# Patient Record
Sex: Female | Born: 1975
Health system: Southern US, Community
[De-identification: ages and names within clinical notes are randomized; demographics above are authoritative.]

## PROBLEM LIST (undated history)

## (undated) DIAGNOSIS — I10 Essential (primary) hypertension: Secondary | ICD-10-CM

## (undated) DIAGNOSIS — A549 Gonococcal infection, unspecified: Secondary | ICD-10-CM

## (undated) DIAGNOSIS — Z872 Personal history of diseases of the skin and subcutaneous tissue: Secondary | ICD-10-CM

## (undated) DIAGNOSIS — Z8619 Personal history of other infectious and parasitic diseases: Secondary | ICD-10-CM

## (undated) DIAGNOSIS — Z8742 Personal history of other diseases of the female genital tract: Secondary | ICD-10-CM

## (undated) DIAGNOSIS — E119 Type 2 diabetes mellitus without complications: Secondary | ICD-10-CM

## (undated) DIAGNOSIS — D219 Benign neoplasm of connective and other soft tissue, unspecified: Secondary | ICD-10-CM

## (undated) HISTORY — DX: Essential (primary) hypertension: I10

## (undated) HISTORY — DX: Type 2 diabetes mellitus without complications: E11.9

## (undated) HISTORY — DX: Benign neoplasm of connective and other soft tissue, unspecified: D21.9

## (undated) HISTORY — DX: Gonococcal infection, unspecified: A54.9

## (undated) HISTORY — PX: DILATION AND CURETTAGE OF UTERUS: SHX78

## (undated) HISTORY — PX: BREAST BIOPSY: SHX20

## (undated) HISTORY — DX: Personal history of diseases of the skin and subcutaneous tissue: Z87.2

## (undated) HISTORY — DX: Personal history of other diseases of the female genital tract: Z87.42

## (undated) HISTORY — DX: Personal history of other infectious and parasitic diseases: Z86.19

---

## 2000-12-25 ENCOUNTER — Other Ambulatory Visit: Admission: RE | Admit: 2000-12-25 | Discharge: 2000-12-25 | Payer: Self-pay | Admitting: Family Medicine

## 2002-04-22 ENCOUNTER — Other Ambulatory Visit: Admission: RE | Admit: 2002-04-22 | Discharge: 2002-04-22 | Payer: Self-pay | Admitting: Family Medicine

## 2003-05-26 ENCOUNTER — Other Ambulatory Visit: Admission: RE | Admit: 2003-05-26 | Discharge: 2003-05-26 | Payer: Self-pay | Admitting: Family Medicine

## 2004-08-15 ENCOUNTER — Other Ambulatory Visit: Admission: RE | Admit: 2004-08-15 | Discharge: 2004-08-15 | Payer: Self-pay | Admitting: Family Medicine

## 2005-11-08 ENCOUNTER — Other Ambulatory Visit: Admission: RE | Admit: 2005-11-08 | Discharge: 2005-11-08 | Payer: Self-pay | Admitting: Family Medicine

## 2007-04-01 ENCOUNTER — Other Ambulatory Visit: Admission: RE | Admit: 2007-04-01 | Discharge: 2007-04-01 | Payer: Self-pay | Admitting: Family Medicine

## 2008-04-09 ENCOUNTER — Other Ambulatory Visit: Admission: RE | Admit: 2008-04-09 | Discharge: 2008-04-09 | Payer: Self-pay | Admitting: Family Medicine

## 2009-07-01 ENCOUNTER — Other Ambulatory Visit: Admission: RE | Admit: 2009-07-01 | Discharge: 2009-07-01 | Payer: Self-pay | Admitting: Family Medicine

## 2010-11-01 ENCOUNTER — Other Ambulatory Visit: Payer: Self-pay | Admitting: Family Medicine

## 2010-11-01 ENCOUNTER — Other Ambulatory Visit
Admission: RE | Admit: 2010-11-01 | Discharge: 2010-11-01 | Payer: Self-pay | Source: Home / Self Care | Admitting: Family Medicine

## 2010-11-05 ENCOUNTER — Encounter: Payer: Self-pay | Admitting: Obstetrics and Gynecology

## 2011-08-01 DIAGNOSIS — D219 Benign neoplasm of connective and other soft tissue, unspecified: Secondary | ICD-10-CM

## 2011-08-01 HISTORY — DX: Benign neoplasm of connective and other soft tissue, unspecified: D21.9

## 2011-11-21 DIAGNOSIS — L408 Other psoriasis: Secondary | ICD-10-CM | POA: Insufficient documentation

## 2011-11-21 DIAGNOSIS — T148XXA Other injury of unspecified body region, initial encounter: Secondary | ICD-10-CM | POA: Insufficient documentation

## 2012-01-25 ENCOUNTER — Encounter: Payer: Self-pay | Admitting: Obstetrics and Gynecology

## 2012-01-30 ENCOUNTER — Telehealth: Payer: Self-pay | Admitting: Obstetrics and Gynecology

## 2012-02-01 ENCOUNTER — Other Ambulatory Visit: Payer: Self-pay

## 2012-02-01 MED ORDER — NORETHIN ACE-ETH ESTRAD-FE 1-20 MG-MCG PO TABS: 1.0000 | ORAL_TABLET | Freq: Every day | ORAL | Status: DC

## 2012-02-01 NOTE — Telephone Encounter (Signed)
Spoke with pt rgd msg pt want rf on rx junel fe 1/20 informed rx called to pharm pt voice understanding.

## 2012-02-01 NOTE — Telephone Encounter (Signed)
CHART ROUTED TO NG

## 2012-02-11 ENCOUNTER — Encounter: Payer: Self-pay | Admitting: Obstetrics and Gynecology

## 2012-02-19 ENCOUNTER — Encounter: Payer: Self-pay | Admitting: Obstetrics and Gynecology

## 2012-02-19 ENCOUNTER — Ambulatory Visit (INDEPENDENT_AMBULATORY_CARE_PROVIDER_SITE_OTHER): Payer: 59 | Admitting: Obstetrics and Gynecology

## 2012-02-19 VITALS — BP 120/78 | Ht 67.0 in | Wt 231.0 lb

## 2012-02-19 DIAGNOSIS — D219 Benign neoplasm of connective and other soft tissue, unspecified: Secondary | ICD-10-CM | POA: Insufficient documentation

## 2012-02-19 DIAGNOSIS — D259 Leiomyoma of uterus, unspecified: Secondary | ICD-10-CM

## 2012-02-19 MED ORDER — NORETHIN ACE-ETH ESTRAD-FE 1-20 MG-MCG PO TABS: 1.0000 | ORAL_TABLET | Freq: Every day | ORAL | Status: DC

## 2012-02-19 NOTE — Progress Notes (Signed)
Pt here for f/u symptomatic fibroids.  She is on ocps to help with the bleeding.  Her periods have been normal except one when she bled tor 2 weeks.  No missed pills.  No cp or SOB. Pt does not desire to conceive at this time BP 120/78  Ht 5\' 7"  (1.702 m)  Wt 231 lb (104.781 kg)  BMI 36.18 kg/m2  LMP 02/07/2012 Physical Examination: General appearance - alert, well appearing, and in no distress Mental status - alert, oriented to person, place, and time Chest - clear to auscultation, no wheezes, rales or rhonchi, symmetric air entry Heart - normal rate, regular rhythm, normal S1, S2, no murmurs, rubs, clicks or gallops Abdomen - soft, nontender, nondistended, no masses or organomegaly Pelvic - VULVA: normal appearing vulva with no masses, tenderness or lesions, VAGINA: normal appearing vagina with normal color and discharge, no lesions, CERVIX: normal appearing cervix without discharge or lesions, UTERUS: uterus is normal size, shape, consistency and nontender, enlarged to 14 week's size, ADNEXA: normal adnexa in size, nontender and no masses Back exam - full range of motion, no tenderness, palpable spasm or pain on motion Neurological - alert, oriented, normal speech, no focal findings or movement disorder noted Musculoskeletal - no joint tenderness, deformity or swelling Sx fibroids Periods normal with ocps bp and exam normal can continue ocps Rt 4 months for annual

## 2012-06-12 ENCOUNTER — Ambulatory Visit: Payer: 59 | Admitting: Obstetrics and Gynecology

## 2012-07-29 ENCOUNTER — Ambulatory Visit: Payer: 59 | Admitting: Obstetrics and Gynecology

## 2012-08-01 ENCOUNTER — Ambulatory Visit (INDEPENDENT_AMBULATORY_CARE_PROVIDER_SITE_OTHER): Payer: 59 | Admitting: Obstetrics and Gynecology

## 2012-08-01 ENCOUNTER — Encounter: Payer: Self-pay | Admitting: Obstetrics and Gynecology

## 2012-08-01 VITALS — BP 122/84 | Ht 66.0 in | Wt 230.0 lb

## 2012-08-01 DIAGNOSIS — D259 Leiomyoma of uterus, unspecified: Secondary | ICD-10-CM

## 2012-08-01 DIAGNOSIS — D219 Benign neoplasm of connective and other soft tissue, unspecified: Secondary | ICD-10-CM

## 2012-08-01 DIAGNOSIS — Z124 Encounter for screening for malignant neoplasm of cervix: Secondary | ICD-10-CM

## 2012-08-01 NOTE — Patient Instructions (Signed)
Fibroids Fibroids are lumps (tumors) that can occur any place in a woman's body. These lumps are not cancerous. Fibroids vary in size, weight, and where they grow. HOME CARE  Do not take aspirin.  Write down the number of pads or tampons you use during your period. Tell your doctor. This can help determine the best treatment for you. GET HELP RIGHT AWAY IF:  You have pain in your lower belly (abdomen) that is not helped with medicine.  You have cramps that are not helped with medicine.  You have more bleeding between or during your period.  You feel lightheaded or pass out (faint).  Your lower belly pain gets worse. MAKE SURE YOU:  Understand these instructions.  Will watch your condition.  Will get help right away if you are not doing well or get worse. Document Released: 11/03/2010 Document Revised: 12/24/2011 Document Reviewed: 11/03/2010 [employer] Patient Information 2013 [employer].  

## 2012-08-01 NOTE — Progress Notes (Signed)
Last Pap: 2012 WNL: Yes Regular Periods:yes Contraception: junel  Monthly Breast exam:yes Tetanus<22yrs:yes Nl.Bladder Function:yes Daily BMs:yes Healthy Diet:yes Calcium:yes Mammogram:no Date of Mammogram: n/a Exercise:yes Have often Exercise: occ Seatbelt: yes Abuse at home: no Stressful work:no Sigmoid-colonoscopy: n/a Bone Density: No PCP: Dr.Carola Westermenn Change in PMH: no change Change in FMH:no change BP 122/84  Ht 5\' 6"  (1.676 m)  Wt 230 lb (104.327 kg)  BMI 37.12 kg/m2  LMP 07/24/2012 Pt with complaints:yes and she has frequent urination and spotting 10 days before eachc ycle Physical Examination: General appearance - alert, well appearing, and in no distress Mental status - normal mood, behavior, speech, dress, motor activity, and thought processes Neck - supple, no significant adenopathy,  thyroid exam: thyroid is normal in size without nodules or tenderness Chest - clear to auscultation, no wheezes, rales or rhonchi, symmetric air entry Heart - normal rate and regular rhythm Abdomen - soft, nontender, nondistended, no masses or organomegaly Breasts - breasts appear normal, no suspicious masses, no skin or nipple changes or axillary nodes Pelvic - normal external genitalia, vulva, vagina, cervix,  and adnexa uterus about 12 weeks size and irreg Rectal - rectal exam not indicated Back exam - full range of motion, no tenderness, palpable spasm or pain on motion Neurological - alert, oriented, normal speech, no focal findings or movement disorder noted Musculoskeletal - no joint tenderness, deformity or swelling Extremities - no edema, redness or tenderness in the calves or thighs Skin - normal coloration and turgor, no rashes, no suspicious skin lesions noted Routine exam Sx fibroids.  Check urine cx Pap sent yes Mammogram due no lo estrin used for contraception RT 1 yr or prn Pt desires obs for sx fibroids for now she willcall if she changes her mind.

## 2012-08-04 LAB — PAP IG W/ RFLX HPV ASCU

## 2012-11-10 ENCOUNTER — Encounter: Payer: 59 | Admitting: Obstetrics and Gynecology

## 2012-11-20 ENCOUNTER — Encounter: Payer: 59 | Admitting: Obstetrics and Gynecology

## 2012-12-24 DIAGNOSIS — L219 Seborrheic dermatitis, unspecified: Secondary | ICD-10-CM | POA: Insufficient documentation

## 2014-01-27 ENCOUNTER — Other Ambulatory Visit (HOSPITAL_COMMUNITY)
Admission: RE | Admit: 2014-01-27 | Discharge: 2014-01-27 | Disposition: A | Discharge status: Home / Self Care | Payer: 59 | Source: Ambulatory Visit | Attending: Family Medicine | Admitting: Family Medicine

## 2014-01-27 ENCOUNTER — Other Ambulatory Visit: Payer: Self-pay | Admitting: Family Medicine

## 2014-01-27 DIAGNOSIS — Z124 Encounter for screening for malignant neoplasm of cervix: Secondary | ICD-10-CM | POA: Insufficient documentation

## 2014-01-27 DIAGNOSIS — Z1151 Encounter for screening for human papillomavirus (HPV): Secondary | ICD-10-CM | POA: Insufficient documentation

## 2014-01-28 ENCOUNTER — Other Ambulatory Visit: Payer: Self-pay | Admitting: Family Medicine

## 2014-01-28 DIAGNOSIS — N644 Mastodynia: Secondary | ICD-10-CM

## 2014-02-11 ENCOUNTER — Other Ambulatory Visit: Payer: Self-pay | Admitting: Family Medicine

## 2014-02-11 ENCOUNTER — Ambulatory Visit
Admission: RE | Admit: 2014-02-11 | Discharge: 2014-02-11 | Disposition: A | Discharge status: Home / Self Care | Payer: 59 | Source: Ambulatory Visit | Attending: Family Medicine | Admitting: Family Medicine

## 2014-02-11 DIAGNOSIS — N644 Mastodynia: Secondary | ICD-10-CM

## 2014-02-11 DIAGNOSIS — N6310 Unspecified lump in the right breast, unspecified quadrant: Secondary | ICD-10-CM

## 2014-02-18 ENCOUNTER — Ambulatory Visit
Admission: RE | Admit: 2014-02-18 | Discharge: 2014-02-18 | Disposition: A | Discharge status: Home / Self Care | Payer: 59 | Source: Ambulatory Visit | Attending: Family Medicine | Admitting: Family Medicine

## 2014-02-18 DIAGNOSIS — N6310 Unspecified lump in the right breast, unspecified quadrant: Secondary | ICD-10-CM

## 2016-01-24 ENCOUNTER — Ambulatory Visit: Payer: Self-pay | Admitting: Dietician

## 2016-02-22 ENCOUNTER — Encounter: Payer: Self-pay | Admitting: Dietician

## 2016-02-22 ENCOUNTER — Encounter: Payer: 59 | Attending: Internal Medicine | Admitting: Dietician

## 2016-02-22 DIAGNOSIS — E119 Type 2 diabetes mellitus without complications: Secondary | ICD-10-CM | POA: Diagnosis present

## 2016-02-22 NOTE — Patient Instructions (Addendum)
-  Start having breakfast every morning (oatmeal + boiled egg) -Establish a routine -Add a protein to fruit for morning snack (Babybel cheese) -Continue to avoid juice, soda, and any drink with sugar  -Exercise regularly -Preportion desserts (have 2 or 3 chocolate squares at a time)

## 2016-02-22 NOTE — Progress Notes (Signed)
Diabetes Self-Management Education  Visit Type: First/Initial  Appt. Start Time: 320 Appt. End Time: 415  02/22/2016  Kristen Wise, identified by name and date of birth, is a 40 y.o. female with a diagnosis of Diabetes: Type 2.   ASSESSMENT  There were no vitals taken for this visit. There is no weight on file to calculate BMI.      Diabetes Self-Management Education - 02/22/16 1644    Psychosocial Assessment   What is the last grade level you completed in school? Bachelor's degree   Pre-Education Assessment   Patient understands the diabetes disease and treatment process. Needs Instruction   Patient understands incorporating nutritional management into lifestyle. Needs Instruction   Patient undertands incorporating physical activity into lifestyle. Needs Instruction   Patient understands using medications safely. Needs Instruction   Patient understands monitoring blood glucose, interpreting and using results Needs Instruction   Patient understands prevention, detection, and treatment of acute complications. Needs Instruction   Patient understands prevention, detection, and treatment of chronic complications. Needs Instruction   Patient understands how to develop strategies to address psychosocial issues. Needs Instruction   Patient understands how to develop strategies to promote health/change behavior. Needs Instruction   Exercise   Exercise Type Light (walking / raking leaves)   How many days per week to you exercise? 7   How many minutes per day do you exercise? 30   Total minutes per week of exercise 210   Patient Education   Disease state  Definition of diabetes, type 1 and 2, and the diagnosis of diabetes;Factors that contribute to the development of diabetes   Nutrition management  Role of diet in the treatment of diabetes and the relationship between the three main macronutrients and blood glucose level;Carbohydrate counting;Food label reading, portion sizes and  measuring food.   Physical activity and exercise  Role of exercise on diabetes management, blood pressure control and cardiac health.   Personal strategies to promote health Lifestyle issues that need to be addressed for better diabetes care;Helped patient develop diabetes management plan for (enter comment)  diet counseling and macronutrient balance   Individualized Goals (developed by patient)   Nutrition Follow meal plan discussed   Physical Activity 30 minutes per day;45 minutes per day   Medications take my medication as prescribed   Post-Education Assessment   Patient understands the diabetes disease and treatment process. Demonstrates understanding / competency   Patient understands incorporating nutritional management into lifestyle. Demonstrates understanding / competency   Patient undertands incorporating physical activity into lifestyle. Demonstrates understanding / competency   Patient understands using medications safely. Demonstrates understanding / competency   Patient understands monitoring blood glucose, interpreting and using results Needs Review   Patient understands prevention, detection, and treatment of acute complications. Needs Review   Patient understands prevention, detection, and treatment of chronic complications. Needs Review   Patient understands how to develop strategies to address psychosocial issues. Needs Review   Patient understands how to develop strategies to promote health/change behavior. Needs Review   Outcomes   Expected Outcomes Demonstrated interest in learning. Expect positive outcomes   Future DMSE 3-4 months   Program Status Not Completed      Individualized Plan for Diabetes Self-Management Training:   Learning Objective:  Patient will have a greater understanding of diabetes self-management. Patient education plan is to attend individual and/or group sessions per assessed needs and concerns.   Plan:   Patient Instructions  -Start having  breakfast every morning (oatmeal + boiled  egg) -Establish a routine -Add a protein to fruit for morning snack (Babybel cheese) -Continue to avoid juice, soda, and any drink with sugar  -Exercise regularly -Preportion desserts (have 2 or 3 chocolate squares at a time)   Expected Outcomes:  Demonstrated interest in learning. Expect positive outcomes  Education material provided: Living Well with Diabetes, Meal plan card, My Plate and Snack sheet  If problems or questions, patient to contact team via:  Phone and Email  Future DSME appointment: 3-4 months

## 2016-07-09 ENCOUNTER — Ambulatory Visit: Payer: 59 | Admitting: Dietician

## 2016-10-01 ENCOUNTER — Other Ambulatory Visit: Payer: Self-pay | Admitting: Internal Medicine

## 2016-10-01 DIAGNOSIS — Z1231 Encounter for screening mammogram for malignant neoplasm of breast: Secondary | ICD-10-CM

## 2016-11-07 ENCOUNTER — Ambulatory Visit
Admission: RE | Admit: 2016-11-07 | Discharge: 2016-11-07 | Disposition: A | Discharge status: Home / Self Care | Payer: 59 | Source: Ambulatory Visit | Attending: Internal Medicine | Admitting: Internal Medicine

## 2016-11-07 DIAGNOSIS — Z1231 Encounter for screening mammogram for malignant neoplasm of breast: Secondary | ICD-10-CM

## 2016-11-21 DIAGNOSIS — R52 Pain, unspecified: Secondary | ICD-10-CM | POA: Insufficient documentation

## 2016-11-30 ENCOUNTER — Other Ambulatory Visit: Payer: Self-pay | Admitting: Obstetrics and Gynecology

## 2016-11-30 ENCOUNTER — Other Ambulatory Visit (HOSPITAL_COMMUNITY)
Admission: RE | Admit: 2016-11-30 | Discharge: 2016-11-30 | Disposition: A | Discharge status: Home / Self Care | Payer: 59 | Source: Ambulatory Visit | Attending: Obstetrics and Gynecology | Admitting: Obstetrics and Gynecology

## 2016-11-30 DIAGNOSIS — Z1151 Encounter for screening for human papillomavirus (HPV): Secondary | ICD-10-CM | POA: Insufficient documentation

## 2016-11-30 DIAGNOSIS — Z01419 Encounter for gynecological examination (general) (routine) without abnormal findings: Secondary | ICD-10-CM | POA: Diagnosis present

## 2016-12-03 LAB — CYTOLOGY - PAP
DIAGNOSIS: NEGATIVE
HPV: NOT DETECTED

## 2017-02-06 DIAGNOSIS — R399 Unspecified symptoms and signs involving the genitourinary system: Secondary | ICD-10-CM | POA: Insufficient documentation

## 2017-02-06 DIAGNOSIS — J029 Acute pharyngitis, unspecified: Secondary | ICD-10-CM | POA: Insufficient documentation

## 2017-05-01 DIAGNOSIS — Z6838 Body mass index (BMI) 38.0-38.9, adult: Secondary | ICD-10-CM | POA: Insufficient documentation

## 2017-06-04 ENCOUNTER — Encounter: Payer: Self-pay | Admitting: Neurology

## 2017-06-05 ENCOUNTER — Ambulatory Visit (INDEPENDENT_AMBULATORY_CARE_PROVIDER_SITE_OTHER): Payer: 59 | Admitting: Neurology

## 2017-06-05 ENCOUNTER — Encounter: Payer: Self-pay | Admitting: Neurology

## 2017-06-05 ENCOUNTER — Encounter (INDEPENDENT_AMBULATORY_CARE_PROVIDER_SITE_OTHER): Payer: Self-pay

## 2017-06-05 VITALS — BP 126/75 | HR 82 | Ht 66.0 in | Wt 242.5 lb

## 2017-06-05 DIAGNOSIS — Z72820 Sleep deprivation: Secondary | ICD-10-CM | POA: Insufficient documentation

## 2017-06-05 DIAGNOSIS — R0681 Apnea, not elsewhere classified: Secondary | ICD-10-CM

## 2017-06-05 DIAGNOSIS — K219 Gastro-esophageal reflux disease without esophagitis: Secondary | ICD-10-CM | POA: Diagnosis not present

## 2017-06-05 DIAGNOSIS — G4710 Hypersomnia, unspecified: Secondary | ICD-10-CM

## 2017-06-05 DIAGNOSIS — G4730 Sleep apnea, unspecified: Secondary | ICD-10-CM

## 2017-06-05 DIAGNOSIS — R0683 Snoring: Secondary | ICD-10-CM | POA: Insufficient documentation

## 2017-06-05 NOTE — Progress Notes (Signed)
SLEEP MEDICINE CLINIC   Provider:  Larey Seat, M D  Primary Care Physician:  Glendale Chard, MD   Referring Provider: Dr. Baird Cancer, MD  Chief Complaint  Patient presents with  . New Patient (Initial Visit)    scheduled for hysterectomy and she is just making sure she doesnt have any sleep apnea, husband informs the patient that she snores, she said few years back she was sleeping on her back and she felt herself stop breathing and so since then she sleeps on side.     HPI:  Kristen Wise is a 41 y.o. female , seen here as in a referral/ revisit  from Dr. Baird Cancer, for a sleep consultation , as patient is concerned about a upcoming surgery. Mrs. Kristen Wise has a history of obesity, with about body mass index of 38, hypertension currently controlled on medication, type 2 diabetes mellitus, on metformin and she is on 3 antihypertensive medications.  Chief complaint according to patient :"My husband tells me I snore very loud"    Sleep habits are as follows:  For the last hour before she retreats to the bedroom the patient will watch TV, and usually retreats by about midnight. The master bedroom is cool, quiet and dark. She has begun sleeping on the side as her husband told her she snores loudest in supine. She sleeps only with one pillow. Her husband goes to bed a little bit earlier than her and works as a Administrator. She usually wakes up between 2 and 3 AM with the urge to urinate, some nights more than once. She can go easily back to sleep. She reports vivid dreams but usually not nightmarish. As many people do,  she also dreams about falling sometimes , sometimes being chased, but often she laughs in her sleep. She may also talk in her sleep. She does set an alarm at 5 AM, but is often spontaneously awake just before. However, she does not allocate more than 5 hours of sleep.   Sleep medical history and family sleep history: The patient was not aware of any sleep disorders affecting  family members.    The patient remembers that she did last slept 7 or 8 hours nightly in her 73s. She had more energy than, she gained weight after , and she was neither hypertensive nor diabetic. She never sleep walked or had night terrors.   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 She is a nonsmoker, she seldomly drinks alcohol, she likes Starbucks coffee, and eats chocolates.  Review of Systems: Out of a complete 14 system review, the patient complains of only the following symptoms, and all other reviewed systems are negative. Patient reported snoring, the fatigue severity score was endorsed at 33 and this Epworth sleepiness score at 8 points.   I reviewed the patient's medications : amlodipine, olmersartan, spironolactone, iron supplement, metformin.     Social History   Social History  . Marital status: Unknown    Spouse name: N/A  . Number of children: N/A  . Years of education: N/A   Occupational History  . Not on file.   Social History Main Topics  . Smoking status: Never Smoker  . Smokeless tobacco: Never Used  . Alcohol use No  . Drug use: No  . Sexual activity: Not on file   Other Topics Concern  . Not on file   Social History Narrative  . No narrative on file    Family History  Problem Relation Age of Onset  . Diabetes Maternal Grandmother     Past Medical History:  Diagnosis Date  . Diabetes mellitus without complication ([place])   . Fibroid 08/01/2011   utrerine  . Gonorrhea   . H/O psoriasis   . History of chicken pox   . History of irregular menstrual bleeding   . Hypertension     Past Surgical History:  Procedure Laterality Date  . DILATION AND CURETTAGE OF UTERUS      Current Outpatient Prescriptions  Medication Sig Dispense Refill  . amLODipine (NORVASC) 10 MG tablet Take 10 mg by mouth daily.    Marland Kitchen BLISOVI FE 1.5/30 1.5-30 MG-MCG tablet     . ibuprofen (ADVIL,MOTRIN) 600 MG tablet Take 600 mg by  mouth every 6 (six) hours as needed.    . Iron-FA-B Cmp-C-Biot-Probiotic (FUSION PLUS PO) Take 1 tablet by mouth daily.    . metFORMIN (GLUCOPHAGE) 500 MG tablet Take by mouth 2 (two) times daily with a meal.    . olmesartan (BENICAR) 40 MG tablet Take 40 mg by mouth daily.    Marland Kitchen omeprazole (PRILOSEC) 40 MG capsule     . spironolactone (ALDACTONE) 50 MG tablet Take 50 mg by mouth daily.     No current facility-administered medications for this visit.     Allergies as of 06/05/2017 - Review Complete 06/05/2017  Allergen Reaction Noted  . Lisinopril Other (See Comments) 03/11/2012    Vitals: BP 126/75   Pulse 82   Ht 5\' 6"  (1.676 m)   Wt 242 lb 8 oz (110 kg)   BMI 39.14 kg/m  Last Weight:  Wt Readings from Last 1 Encounters:  06/05/17 242 lb 8 oz (110 kg)   TGG:YIRS mass index is 39.14 kg/m.     Last Height:   Ht Readings from Last 1 Encounters:  06/05/17 5\' 6"  (1.676 m)    Physical exam:  General: The patient is awake, alert and appears not in acute distress. The patient is well groomed. Head: Normocephalic, atraumatic. Neck is supple. Mallampati 5, large tongue   neck circumference:19. Nasal airflow patent ,Retrognathia is noted  Cardiovascular:  Regular rate and rhythm , without  murmurs or carotid bruit, and without distended neck veins. Respiratory: Lungs are clear to auscultation. Skin:  Without evidence of edema, or rash Trunk: BMI is 39. The patient's posture is erect.   Neurologic exam : The patient is awake and alert, oriented to place and time.   Attention span & concentration ability appears normal.  Speech is fluent,  without  dysarthria, dysphonia or aphasia.  Mood and affect are appropriate.  Cranial nerves: Pupils are equal and briskly reactive to light. Extraocular movements  in vertical and horizontal planes intact and without nystagmus. Visual fields by finger perimetry are intact. Hearing to finger rub intact.  Facial sensation intact to fine touch.  Facial motor strength is symmetric and tongue and uvula move midline. Shoulder shrug was symmetrical.   Motor exam: Normal tone, muscle bulk and symmetric strength in all extremities. Sensory:  Fine touch, pinprick and vibration were tested in all extremities.  Coordination: Rapid alternating movements in the fingers/hands was normal. Finger-to-nose maneuver  normal without evidence of ataxia, dysmetria or tremor. Gait and station: Patient walks without assistive device .Tandem gait is unfragmented. She tends to lean to the left. Turns with  3  Steps.  Deep tendon reflexes: in the  upper and lower extremities are symmetric and intact. Babinski maneuver response is downgoing.  Assessment:  After physical and neurologic examination, review of laboratory studies,  Personal review of imaging studies, reports of other /same  Imaging studies, results of polysomnography and / or neurophysiology testing and pre-existing records as far as provided in visit., my assessment is   1) Mrs. Grenfell has essentially 2 sleep conditions, the first one is she gets not enough sleep. I advised her to try to go to bed before midnight maybe around 10 and about the importance of slow wave sleep in the first 2 hours of sleep. She may also find more success in weight loss, diabetes control and hypertension reduction if she gets those pressures 2 hours of slow wave sleep.  2) I strongly suspect that Mrs. Halley has obstructive sleep apnea. Her neck circumference is about 19 inches, she has a high-grade Mallampati with a swollen uvula, and her BMI has exceeded 39. That her husband has witnessed her to snore on her back is not surprising, but she has a physiological makeup to also have obstructive sleep apnea and not just simple upper airway resistance.  The patient was advised of the nature of the diagnosed disorder , the treatment options and the  risks for general health and wellness arising from not treating the condition.    I spent more than 45 minutes of face to face time with the patient.  Greater than 50% of time was spent in counseling and coordination of care. We have discussed the diagnosis and differential and I answered the patient's questions.    Plan:  Treatment plan and additional workup : HST, and sleep hygiene instruction.    Larey Seat, MD 12/13/6008, 9:32 AM  Certified in Neurology by ABPN Certified in [place] by [facility] [address] [place],  [address]

## 2017-06-05 NOTE — Patient Instructions (Signed)

## 2017-07-03 ENCOUNTER — Institutional Professional Consult (permissible substitution): Payer: Self-pay | Admitting: Neurology

## 2017-07-03 ENCOUNTER — Ambulatory Visit (INDEPENDENT_AMBULATORY_CARE_PROVIDER_SITE_OTHER): Payer: 59 | Admitting: Neurology

## 2017-07-03 DIAGNOSIS — R0681 Apnea, not elsewhere classified: Principal | ICD-10-CM

## 2017-07-03 DIAGNOSIS — R0683 Snoring: Secondary | ICD-10-CM

## 2017-07-03 DIAGNOSIS — G4710 Hypersomnia, unspecified: Secondary | ICD-10-CM

## 2017-07-03 DIAGNOSIS — Z72820 Sleep deprivation: Secondary | ICD-10-CM

## 2017-07-03 DIAGNOSIS — K219 Gastro-esophageal reflux disease without esophagitis: Secondary | ICD-10-CM

## 2017-07-03 DIAGNOSIS — G4730 Sleep apnea, unspecified: Secondary | ICD-10-CM

## 2017-07-05 NOTE — Procedures (Signed)
NAME: [name]                   DOB: 12/13/1975 MEDICAL RECORD [id number]      DOS: 07/03/17 REFERRING PHYSICIAN: Glendale Chard, MD STUDY PERFORMED: Home Sleep Study HISTORY:  Kristen Wise is a 41 year old female Patient, seen here as patient is concerned about a upcoming surgery. [name] has a history of obesity, with about body mass index of 38, hypertension currently controlled on medication, type 2 diabetes mellitus, on metformin and she is on 3 antihypertensive medications. Chief complaint according to patient: "My husband tells me I snore very loud"   Patient reported snoring, the fatigue severity score was endorsed at 33 and this Epworth sleepiness score at 8 points.     STUDY RESULTS:  Total Recording:   7 hours and 47  minutes Total Apnea/Hypopnea Index (AHI):    0.1 /hour, RDI was 1.6/hr Average Oxygen Saturation: SpO2   95 % Lowest Oxygen Saturation:   SpO2 76 %  Time Oxygen Saturation Below 88%:  4 minutes = 1 % Average Heart Rate:     75 bpm (42-155 bpm)  IMPRESSION: This HST did not indicate the presence of sleep apnea, but a highly variable heart rate was noted.  I recommend to either repeat [name] HST or perform an attended sleep study, as these findings do not reflect the clinical observation by her husband. I certify that I have reviewed the raw data recording prior to the issuance of this report in accordance with the standards of the American Academy of Sleep Medicine (AASM). Larey Seat, MD      07-05-2017  [facility] [place]; [place], ABPN and ABSM Member of and accredited by AASM

## 2017-07-05 NOTE — Addendum Note (Signed)
Addended by: Larey Seat on: 07/05/2017 02:17 PM   Modules accepted: Orders

## 2017-07-08 ENCOUNTER — Telehealth: Payer: Self-pay | Admitting: Neurology

## 2017-07-08 NOTE — Telephone Encounter (Signed)
Called the patient to go over sleep study results. No answer at this time. LVM for patient to call back so that we may go over them.

## 2017-07-08 NOTE — Telephone Encounter (Signed)
-----   Message from Larey Seat, MD sent at 07/05/2017  2:17 PM EDT ----- Cc Dr Baird Cancer, please. No apnea on this test, conflicting with clinical history. Will repeat. CD

## 2017-07-09 ENCOUNTER — Other Ambulatory Visit: Payer: Self-pay | Admitting: Neurology

## 2017-07-09 DIAGNOSIS — G4733 Obstructive sleep apnea (adult) (pediatric): Secondary | ICD-10-CM

## 2017-07-09 NOTE — Telephone Encounter (Addendum)
Called patient back and informed her that no apnea was seen on the HST. Dr Dohmeier is recommending either repeating the HST or split night study in the lab. Pt is agreeable to either. I spoke with Robin Hyler in the sleep lab and she recommends a split night be done. I have placed the order for that. Informed the patient that someone will call from sleep lab to get her set up for whichever test is approved thru her insurance. Pt verbalized understanding and had no further questions.

## 2017-07-09 NOTE — Telephone Encounter (Signed)
Patient called office returning RN's call.  Please call °

## 2017-09-27 ENCOUNTER — Ambulatory Visit (INDEPENDENT_AMBULATORY_CARE_PROVIDER_SITE_OTHER): Payer: 59 | Admitting: Neurology

## 2017-09-27 DIAGNOSIS — F5104 Psychophysiologic insomnia: Secondary | ICD-10-CM

## 2017-09-27 DIAGNOSIS — G4733 Obstructive sleep apnea (adult) (pediatric): Secondary | ICD-10-CM

## 2017-10-09 NOTE — Procedures (Signed)
PATIENT'S NAME:  Kristen Wise, Kristen Wise DOB:      July 28, 1976      MRN:    [id number]     DATE OF RECORDING: 09/27/2017 REFERRING M.D.:  Glendale Chard, M.D. Study Performed:   Baseline Polysomnogram HISTORY:  This PSG follows a HST from 07-03-2017, based on patient's upcoming surgery and her husband's report of her snoring loudly. The HST revealed no significant apnea, but tachy -bradycardia.  As the findings did not reflect her husband's observations, a PSG was ordered.   The patient endorsed the Epworth Sleepiness Scale at 8 points.  The patient's weight 252 pounds with a height of 66 (inches), resulting in a BMI of 40.4 kg/m2.The patient's neck circumference measured 19 inches.  CURRENT MEDICATIONS: Norvasc, Blisovi FE, Advil, Fusion Plus, Glucophage, Benicar, Prilosec, Aldactone, Lisinopril   PROCEDURE:  This is a multichannel digital polysomnogram utilizing the Somnostar 11.2 system.  Electrodes and sensors were applied and monitored per AASM Specifications.   EEG, EOG, Chin and Limb EMG, were sampled at 200 Hz.  ECG, Snore and Nasal Pressure, Thermal Airflow, Respiratory Effort, CPAP Flow and Pressure, Oximetry was sampled at 50 Hz. Digital video and audio were recorded.      BASELINE STUDY Lights Out was at 22:12 and Lights On at 05:02.  Total recording time (TRT) was 410.5 minutes, with a total sleep time (TST) of 284 minutes.   The patient's sleep latency was 14 minutes.  REM latency was 99.5 minutes.  The sleep efficiency was poor at  69.2 %.     SLEEP ARCHITECTURE: WASO (Wake after sleep onset) was 34.5 minutes.  There were 18.5 minutes in Stage N1, 206 minutes Stage N2, 22.5 minutes Stage N3 and 37 minutes in Stage REM.  The percentage of Stage N1 was 6.5%, Stage N2 was 72.5%, Stage N3 was 7.9% and Stage R (REM sleep) was 13.%.  RESPIRATORY ANALYSIS:  There was 1 respiratory event:  1 apnea and 0 hypopneas with 0 respiratory event related arousals (RERAs).     The total APNEA/HYPOPNEA INDEX  (AHI) was 0.2/hour and the total RESPIRATORY DISTURBANCE INDEX was 0.2 /hour.  1 event occurred in REM sleep and 0 events in NREM. The REM AHI was 1.6 /hour, versus a non-REM AHI of 0. The patient spent 16 minutes of total sleep time in the supine position and 268 minutes in non-supine. The supine AHI was 0.0 versus a non-supine AHI of 0.2.  OXYGEN SATURATION & C02:  The Wake baseline 02 saturation was 96%, with the lowest being 88%. Time spent below 89% saturation equaled 1 minutes.   PERIODIC LIMB MOVEMENTS:  The patient had a total of 21 Periodic Limb Movements.  The Periodic Limb Movement (PLM) index was 4.4 and the PLM Arousal index was 0.0/hour. The arousals were noted as: 22 were spontaneous, 0 were associated with PLMs, and 0 were associated with respiratory events.  Audio and video analysis did not show any abnormal or unusual movements, behaviors, phonations or vocalizations. The patient described her sleep as representative of normal sleep at home.    The patient took one bathroom break. Mild Snoring was noted. EKG was in keeping with normal sinus rhythm (NSR).   IMPRESSION: Poor sleep efficiency, Insomnia.  No sleep disorder was identified. The patient presented neither with apnea, nor PLMs, nor hypoxemia.   RECOMMENDATIONS:  1. Positional therapy can help to reduce the anyway mild snoring.  2. Avoid sedative-hypnotics and alcohol, which may worsen snoring 3. Advise to lose weight, diet  and exercise if not contraindicated (BMI 40.4). 4. Avoid caffeine-containing beverages and chocolate and consider dedicated sleep psychology referral if insomnia is of clinical concern.   5. A follow up appointment can be scheduled in the Sleep Clinic at [facility]. The referring provider will be notified of the results.      I certify that I have reviewed the entire raw data recording prior to the issuance of this report in accordance with the Standards of Accreditation of the  American Academy of Sleep Medicine (AASM)   Larey Seat, MD   10-09-2017  Diplomat, American Board of Psychiatry and Neurology  Diplomat, American Board of [place] Director, [employer] [facility] [employer]

## 2017-10-10 ENCOUNTER — Telehealth: Payer: Self-pay | Admitting: Neurology

## 2017-10-10 NOTE — Telephone Encounter (Signed)
Called and spoke with the patient and made her aware that the sleep study was negative for sleep apnea. I went over good sleep hygiene. Pt was appreciative for the call back and verbalized understanding.

## 2017-10-10 NOTE — Telephone Encounter (Signed)
Pt called and wanted to know the results to her sleep study. She stated she is having surgery next week and would like to know the results prior to surgery.

## 2017-10-21 NOTE — Telephone Encounter (Signed)
Patient calling. She states sleep results were to be mailed to her but has not received yet. She has a procedure scheduled for tomorrow, Please call.

## 2017-10-21 NOTE — Telephone Encounter (Signed)
Called the pt, I am fairly certain that I had sent the study out. In meantime I was able to get fax number from the patient to the MD doing her surgery tomorrow and I have forwarded the sleep study results to them through fax. Pt verbalized understanding.

## 2017-10-31 HISTORY — PX: ABDOMINAL HYSTERECTOMY: SHX81

## 2018-01-17 ENCOUNTER — Other Ambulatory Visit: Payer: Self-pay | Admitting: Internal Medicine

## 2018-01-17 DIAGNOSIS — Z1231 Encounter for screening mammogram for malignant neoplasm of breast: Secondary | ICD-10-CM

## 2018-02-07 ENCOUNTER — Ambulatory Visit
Admission: RE | Admit: 2018-02-07 | Discharge: 2018-02-07 | Disposition: A | Discharge status: Home / Self Care | Payer: 59 | Source: Ambulatory Visit | Attending: Internal Medicine | Admitting: Internal Medicine

## 2018-02-07 DIAGNOSIS — Z1231 Encounter for screening mammogram for malignant neoplasm of breast: Secondary | ICD-10-CM

## 2018-04-07 ENCOUNTER — Ambulatory Visit: Payer: 59 | Admitting: Podiatry

## 2018-04-07 ENCOUNTER — Ambulatory Visit (INDEPENDENT_AMBULATORY_CARE_PROVIDER_SITE_OTHER): Payer: 59

## 2018-04-07 ENCOUNTER — Encounter: Payer: Self-pay | Admitting: Podiatry

## 2018-04-07 DIAGNOSIS — Z79899 Other long term (current) drug therapy: Secondary | ICD-10-CM | POA: Insufficient documentation

## 2018-04-07 DIAGNOSIS — M2141 Flat foot [pes planus] (acquired), right foot: Secondary | ICD-10-CM

## 2018-04-07 DIAGNOSIS — I10 Essential (primary) hypertension: Secondary | ICD-10-CM | POA: Insufficient documentation

## 2018-04-07 DIAGNOSIS — M2142 Flat foot [pes planus] (acquired), left foot: Secondary | ICD-10-CM

## 2018-04-07 DIAGNOSIS — E1165 Type 2 diabetes mellitus with hyperglycemia: Secondary | ICD-10-CM | POA: Insufficient documentation

## 2018-04-07 DIAGNOSIS — M76829 Posterior tibial tendinitis, unspecified leg: Secondary | ICD-10-CM | POA: Diagnosis not present

## 2018-04-07 DIAGNOSIS — L910 Hypertrophic scar: Secondary | ICD-10-CM | POA: Insufficient documentation

## 2018-04-07 DIAGNOSIS — R300 Dysuria: Secondary | ICD-10-CM | POA: Insufficient documentation

## 2018-04-07 DIAGNOSIS — M722 Plantar fascial fibromatosis: Secondary | ICD-10-CM

## 2018-04-07 DIAGNOSIS — R0602 Shortness of breath: Secondary | ICD-10-CM | POA: Insufficient documentation

## 2018-04-07 NOTE — Progress Notes (Signed)
Subjective:    Patient ID: Kristen Wise, female    DOB: 01-08-1976, 42 y.o.   MRN: [id number]  HPI 42 year old female presents the office today for concerns of pain to both of her feet.  She states that she had flatfeet her entire life she started to get pain on the side of her foot and she points on the medial aspect.  She denies any recent injury or trauma.  She has pain after being on her feet for some time.  She denies any numbness or tingling.  Pain does not wake her up at night.  No recent treatment.  She states that she is under a lot of exercise and walking and does hurt after activity and sometimes her activity.  She has no other concerns today.   Review of Systems  All other systems reviewed and are negative.  Past Medical History:  Diagnosis Date  . Diabetes mellitus without complication ([place])   . Fibroid 08/01/2011   utrerine  . Gonorrhea   . H/O psoriasis   . History of chicken pox   . History of irregular menstrual bleeding   . Hypertension     Past Surgical History:  Procedure Laterality Date  . BREAST BIOPSY Right   . DILATION AND CURETTAGE OF UTERUS       Current Outpatient Medications:  .  amLODipine (NORVASC) 10 MG tablet, Take 10 mg by mouth daily., Disp: , Rfl:  .  BLISOVI FE 1.5/30 1.5-30 MG-MCG tablet, , Disp: , Rfl:  .  ibuprofen (ADVIL,MOTRIN) 600 MG tablet, Take 600 mg by mouth every 6 (six) hours as needed., Disp: , Rfl:  .  Iron-FA-B Cmp-C-Biot-Probiotic (FUSION PLUS PO), Take 1 tablet by mouth daily., Disp: , Rfl:  .  metFORMIN (GLUCOPHAGE) 500 MG tablet, Take by mouth 2 (two) times daily with a meal., Disp: , Rfl:  .  olmesartan (BENICAR) 40 MG tablet, Take 40 mg by mouth daily., Disp: , Rfl:  .  omeprazole (PRILOSEC) 40 MG capsule, , Disp: , Rfl:  .  spironolactone (ALDACTONE) 50 MG tablet, Take 50 mg by mouth daily., Disp: , Rfl:   Allergies  Allergen Reactions  . Lisinopril Other (See Comments)         Objective:   Physical Exam  General: AAO x3, NAD  Dermatological: Skin is warm, dry and supple bilateral. Nails x 10 are well manicured; remaining integument appears unremarkable at this time. There are no open sores, no preulcerative lesions, no rash or signs of infection present.  Vascular: Dorsalis Pedis artery and Posterior Tibial artery pedal pulses are 2/4 bilateral with immedate capillary fill time.There is no pain with calf compression, swelling, warmth, erythema.   Neruologic: Grossly intact via light touch bilateral. Vibratory intact via tuning fork bilateral. Protective threshold with Semmes Wienstein monofilament intact to all pedal sites bilateral.  Negative Tinel sign.  Musculoskeletal: There is a decrease in medial arch height upon weightbearing bilaterally.  There is tenderness mostly along the posterior tibial tendon on the insertion of the navicular tuberosity. There is more tenderness on the right than the left.  Overall the tendon appears to be intact.  She is able to do a single and double heel rise.  There is minimal discomfort on the plantar medial tubercle of the calcaneus at the insertion of plantar fascia.  There is no pain with lateral compression of calcaneus.  Mild equinus is present.  No other areas of tenderness identified at this time.  No edema,  erythema, increase in warmth.  Muscular strength 5/5 in all groups tested bilateral.  Gait: Unassisted, Nonantalgic.     Assessment & Plan:  42 year old female with posterior tibial tendon dysfunction, plantar fasciitis with flatfoot deformity; right side worse than the left -Treatment options discussed including all alternatives, risks, and complications -Etiology of symptoms were discussed -X-rays were obtained and reviewed with the patient. Flatfoot is present. There is no evidence of acute fracture or stress fracture.  -Given the amount of pain on the right side I did dispense a Tri-Lock ankle brace to help rest the posterior tibial  tendon on the right side.  She says to feel well in the right side she can use on the left. -Discussed stretching, icing exercises daily.  Anti-inflammatories as needed.  Can do meloxicam if needed. -Given her foot the potential benefit more from an orthotic and she would like to proceed with them. Kristen Wise evaluated her today and she has multiple orthotics.  Return in about 3 weeks (around 04/28/2018).  Trula Slade DPM

## 2018-04-07 NOTE — Patient Instructions (Signed)
Posterior Tibialis Tendinosis Rehab Ask your health care provider which exercises are safe for you. Do exercises exactly as told by your health care provider and adjust them as directed. It is normal to feel mild stretching, pulling, tightness, or discomfort as you do these exercises, but you should stop right away if you feel sudden pain or your pain gets worse.Do not begin these exercises until told by your health care provider. Stretching and range of motion exercises These exercises warm up your muscles and joints and improve the movement and flexibility in your ankle and foot. These exercises may also help to relieve pain. Exercise A: Standing wall calf stretch, knee straight  1. Stand with your hands against a wall. 2. Extend your __________ leg behind you, and bend your front knee slightly. Keep both of your heels on the floor. 3. Point the toes of your back foot slightly inward. 4. Keeping your heels on the floor and your back knee straight, shift your weight toward the wall. Do not allow your back to arch. You should feel a gentle stretch in the back of your lower leg (calf). 5. Hold this position for __________ seconds. Repeat __________ times. Complete this stretch __________ times a day. Exercise B: Standing wall calf stretch, knee bent 1. Stand with your hands against a wall. 2. Extend your __________ leg behind you, and bend your front knee slightly. Keep both of your heels on the floor. 3. Point the toes of your back foot slightly inward. 4. Unlock your back knee so it is bent. Keep your heels on the floor. You should feel a gentle stretch deep in your calf. 5. Hold this position for __________ seconds. Repeat __________ times. Complete this exercise __________ times a day. Strengthening exercises These exercises build strength and endurance in your ankle and foot. Endurance is the ability to use your muscles for a long time, even after they get tired. Exercise C: Ankle inversion  with band 1. Secure one end of an exercise band or tubing to a fixed object, such as a table leg or a pole, that will stay still when the band is pulled. to an object that will not move if it is pulled on, like a table leg. 2. Loop the other end of the band around the middle of your left / right foot. 3. Sit on the floor facing the object with your __________ leg extended. The band or tube should be slightly tense when your foot is relaxed. 4. Leading with your big toe, slowly bring your __________ foot and ankle inward, toward your other foot. 5. Hold this position for __________ seconds. 6. Slowly return your foot to the starting position. Repeat __________ times. Complete this exercise __________ times a day. Exercise D: Towel curls  1. Sit in a chair on a non-carpeted surface, and put your feet on the floor. 2. Place a towel in front of your feet. If told by your health care provider, add __________ at the end of the towel. 3. Keeping your heel on the floor, put your __________ foot on the towel. 4. Pull the towel toward you by grabbing the towel with your toes and curling them under. Keep your heel on the floor. 5. Let your toes relax. 6. Grab the towel with your toes again. Keep going until the towel is completely underneath your foot. Repeat __________ times. Complete this exercise __________ times a day. Balance exercises These exercises improve or maintain your balance. Balance is important in preventing falls.  Exercise E: Single leg stand 1. Without shoes, stand near a railing or in a doorway. You can hold on to the railing or door frame as needed for balance. 2. Stand on your __________ foot. Keep your big toe down on the floor and try to keep your arch lifted. If balancing in this position is too easy, try the exercise with your eyes closed or while standing on a pillow. 3. Hold this position for __________ seconds. Repeat __________ times. Complete this exercise __________ times a  day. This information is not intended to replace advice given to you by your health care provider. Make sure you discuss any questions you have with your health care provider. Document Released: 10/01/2005 Document Revised: 06/05/2016 Document Reviewed: 06/17/2015 Elsevier Interactive Patient Education  [employer].

## 2018-04-07 NOTE — Progress Notes (Signed)
Patient scanned for f/o to address pes planus and tender PTT at insertion.  Patient approimately 4* RF everted b/l.  Plan on f/o with arch support, RF stability, 4* kirby skive.. Dr. Jacqualyn Posey to drop charge           .

## 2018-04-28 ENCOUNTER — Ambulatory Visit: Payer: 59 | Admitting: Orthotics

## 2018-04-28 ENCOUNTER — Ambulatory Visit (INDEPENDENT_AMBULATORY_CARE_PROVIDER_SITE_OTHER): Payer: 59 | Admitting: Podiatry

## 2018-04-28 DIAGNOSIS — M2142 Flat foot [pes planus] (acquired), left foot: Secondary | ICD-10-CM | POA: Diagnosis not present

## 2018-04-28 DIAGNOSIS — T148XXA Other injury of unspecified body region, initial encounter: Secondary | ICD-10-CM

## 2018-04-28 DIAGNOSIS — M76829 Posterior tibial tendinitis, unspecified leg: Secondary | ICD-10-CM

## 2018-04-28 DIAGNOSIS — M2141 Flat foot [pes planus] (acquired), right foot: Secondary | ICD-10-CM | POA: Diagnosis not present

## 2018-04-29 ENCOUNTER — Telehealth: Payer: Self-pay | Admitting: *Deleted

## 2018-04-29 DIAGNOSIS — M2142 Flat foot [pes planus] (acquired), left foot: Secondary | ICD-10-CM

## 2018-04-29 DIAGNOSIS — M2141 Flat foot [pes planus] (acquired), right foot: Secondary | ICD-10-CM

## 2018-04-29 DIAGNOSIS — M76829 Posterior tibial tendinitis, unspecified leg: Secondary | ICD-10-CM

## 2018-04-29 DIAGNOSIS — T148XXA Other injury of unspecified body region, initial encounter: Secondary | ICD-10-CM

## 2018-04-29 NOTE — Telephone Encounter (Signed)
"  Kristen Wise is scheduled to have a MRI on Saturday.  It needs authorization from [facility]."  We'll take care of it.

## 2018-04-29 NOTE — Progress Notes (Signed)
Patient came in today to pick up custom made foot orthotics.  The goals were accomplished and the patient reported no dissatisfaction with said orthotics.  Patient was advised of breakin period and how to report any issues. 

## 2018-04-29 NOTE — Telephone Encounter (Signed)
Orders given to Gretta Arab, RN for pre-cert and faxed to [facility].

## 2018-04-29 NOTE — Telephone Encounter (Signed)
-----   Message from Trula Slade, DPM sent at 04/29/2018  7:09 AM EDT ----- Can you please order MRI of b/l ankle to rule out PT tendon tear? She would like this done at [facility]. If they do not approve b/l then we will do the right but she wants to get them of both if able.

## 2018-04-29 NOTE — Progress Notes (Signed)
Subjective: 42 year old female presents the office today for follow-up evaluation of bilateral ankle pain.  She said the left side started to hurt more swelling she is requesting an MRI of her ankles.  She states the brace does help however she stopped wearing it she starts to get pain into the ankle she points on the inside, medial aspect of her ankle where she gets discomfort.  She denies any recent injury or trauma to the area no changes otherwise.Denies any systemic complaints such as fevers, chills, nausea, vomiting. No acute changes since last appointment, and no other complaints at this time.   Objective: AAO x3, NAD DP/PT pulses palpable bilaterally, CRT less than 3 seconds There is a decrease in medial arch upon weightbearing.  There is tenderness palpation along the posterior tibial tendon just posterior and inferior to the medial malleolus on the insertion into the navicular tuberosity.  Mild swelling to the skin with no erythema increased warmth there is no area pinpoint tenderness or pain to vibratory sensation.  Mild equinus is present.  Toes tendon appears intact as well as the plantar fascia. No open lesions or pre-ulcerative lesions.  No pain with calf compression, swelling, warmth, erythema  Assessment: Bilateral posterior tibial tendon dysfunction, rule out tear  Plan: -All treatment options discussed with the patient including all alternatives, risks, complications.  -Given the prolonged nature of her symptoms as well as continued pain despite conservative treatment I do think an MRI is reasonable at this time.  Ordered MRIs to be performed bilateral ankles and will do this at the [facility] treatment center.  This is for potential surgical planning. -Orthotics were dispensed today.  Liliane Channel also dispensed new orthotics. -Patient encouraged to call the office with any questions, concerns, change in symptoms.   Trula Slade DPM

## 2018-05-02 ENCOUNTER — Telehealth: Payer: Self-pay | Admitting: *Deleted

## 2018-05-02 NOTE — Telephone Encounter (Signed)
"  I'm scheduled to have a MRI on both of my ankles.  I need to see if both procedures have been authorized by [employer].  Dr. Jacqualyn Posey had mentioned that sometime insurances will not cover for both ankles.  I want to make sure it has been authorized before I go for the appointment tomorrow."  I don't see anywhere in your records that the MRIs need authorization.  You may want to call the facility and ask them.  "I will give them a call.  Thank you."

## 2018-05-03 ENCOUNTER — Ambulatory Visit (HOSPITAL_BASED_OUTPATIENT_CLINIC_OR_DEPARTMENT_OTHER)
Admission: RE | Admit: 2018-05-03 | Discharge: 2018-05-03 | Disposition: A | Discharge status: Home / Self Care | Payer: 59 | Source: Ambulatory Visit | Attending: Podiatry | Admitting: Podiatry

## 2018-05-03 DIAGNOSIS — M2141 Flat foot [pes planus] (acquired), right foot: Secondary | ICD-10-CM | POA: Diagnosis not present

## 2018-05-03 DIAGNOSIS — M659 Synovitis and tenosynovitis, unspecified: Secondary | ICD-10-CM | POA: Diagnosis not present

## 2018-05-03 DIAGNOSIS — T148XXA Other injury of unspecified body region, initial encounter: Secondary | ICD-10-CM

## 2018-05-03 DIAGNOSIS — M2142 Flat foot [pes planus] (acquired), left foot: Secondary | ICD-10-CM

## 2018-05-03 DIAGNOSIS — M76829 Posterior tibial tendinitis, unspecified leg: Secondary | ICD-10-CM | POA: Diagnosis present

## 2018-05-07 ENCOUNTER — Telehealth: Payer: Self-pay | Admitting: Podiatry

## 2018-05-07 NOTE — Telephone Encounter (Signed)
I called the patient to discuss MRI results. We reviewed what it showed and we discussed treatment options. Given the signficant findings discussed surgery but she declines it at this time and wants to think about it. Discussed continuing with conservative treatment. Since bilateral, will hold off on CAM boot. Will start PT and order put in for [facility]. Also like we discussed last appointment she feels her orthotics need to be built up in the arch more. Will have [place] call her to get her scheduled to see Liliane Channel or [place] for this. Will follow-up with her in 6 weeks or sooner if needed. She had no further questions or concerns.   Trula Slade

## 2018-05-08 ENCOUNTER — Telehealth: Payer: Self-pay | Admitting: *Deleted

## 2018-05-08 DIAGNOSIS — T148XXA Other injury of unspecified body region, initial encounter: Secondary | ICD-10-CM

## 2018-05-08 DIAGNOSIS — M2142 Flat foot [pes planus] (acquired), left foot: Secondary | ICD-10-CM

## 2018-05-08 DIAGNOSIS — M76829 Posterior tibial tendinitis, unspecified leg: Secondary | ICD-10-CM

## 2018-05-08 DIAGNOSIS — M2141 Flat foot [pes planus] (acquired), right foot: Secondary | ICD-10-CM

## 2018-05-08 NOTE — Telephone Encounter (Signed)
Orders faxed to [facility].

## 2018-05-12 ENCOUNTER — Ambulatory Visit: Payer: 59 | Admitting: Orthotics

## 2018-05-12 DIAGNOSIS — M2141 Flat foot [pes planus] (acquired), right foot: Secondary | ICD-10-CM

## 2018-05-12 DIAGNOSIS — M2142 Flat foot [pes planus] (acquired), left foot: Secondary | ICD-10-CM

## 2018-05-12 DIAGNOSIS — M722 Plantar fascial fibromatosis: Secondary | ICD-10-CM

## 2018-05-12 DIAGNOSIS — M76829 Posterior tibial tendinitis, unspecified leg: Secondary | ICD-10-CM

## 2018-05-12 DIAGNOSIS — T148XXA Other injury of unspecified body region, initial encounter: Secondary | ICD-10-CM

## 2018-05-12 NOTE — Progress Notes (Signed)
Send f/o back to adjust w/ more arch support and m/l flange and 4* RF varus post.

## 2018-05-14 DIAGNOSIS — M76829 Posterior tibial tendinitis, unspecified leg: Secondary | ICD-10-CM | POA: Insufficient documentation

## 2018-05-14 LAB — BASIC METABOLIC PANEL
BUN: 12 (ref 4–21)
Creatinine: 0.7 (ref 0.5–1.1)
Glucose: 89
POTASSIUM: 4.2 (ref 3.4–5.3)
Sodium: 138 (ref 137–147)

## 2018-05-14 LAB — HEMOGLOBIN A1C: Hemoglobin A1C: 6.4

## 2018-05-27 ENCOUNTER — Ambulatory Visit (INDEPENDENT_AMBULATORY_CARE_PROVIDER_SITE_OTHER): Payer: 59 | Admitting: Orthotics

## 2018-05-27 DIAGNOSIS — M2141 Flat foot [pes planus] (acquired), right foot: Secondary | ICD-10-CM

## 2018-05-27 DIAGNOSIS — M722 Plantar fascial fibromatosis: Secondary | ICD-10-CM

## 2018-05-27 DIAGNOSIS — M76829 Posterior tibial tendinitis, unspecified leg: Secondary | ICD-10-CM

## 2018-05-27 DIAGNOSIS — M2142 Flat foot [pes planus] (acquired), left foot: Secondary | ICD-10-CM

## 2018-05-27 NOTE — Progress Notes (Signed)
Picked put corrected f/o:  Made arch higher.

## 2018-07-29 ENCOUNTER — Other Ambulatory Visit: Payer: Self-pay

## 2018-07-29 ENCOUNTER — Ambulatory Visit: Payer: 59 | Admitting: Sports Medicine

## 2018-07-29 ENCOUNTER — Encounter: Payer: Self-pay | Admitting: Sports Medicine

## 2018-07-29 DIAGNOSIS — M76829 Posterior tibial tendinitis, unspecified leg: Secondary | ICD-10-CM

## 2018-07-29 DIAGNOSIS — M2142 Flat foot [pes planus] (acquired), left foot: Secondary | ICD-10-CM | POA: Diagnosis not present

## 2018-07-29 DIAGNOSIS — M79671 Pain in right foot: Secondary | ICD-10-CM | POA: Diagnosis not present

## 2018-07-29 DIAGNOSIS — M2141 Flat foot [pes planus] (acquired), right foot: Secondary | ICD-10-CM

## 2018-07-29 DIAGNOSIS — M79672 Pain in left foot: Secondary | ICD-10-CM

## 2018-07-29 MED ORDER — MELOXICAM 15 MG PO TABS
15.0000 mg | ORAL_TABLET | Freq: Every day | ORAL | 0 refills | Status: DC
Start: 2018-07-29 — End: 2018-11-17

## 2018-07-29 MED ORDER — METHYLPREDNISOLONE 4 MG PO TBPK: ORAL_TABLET | ORAL | 0 refills | Status: DC

## 2018-07-29 NOTE — Progress Notes (Signed)
Subjective: Kristen Wise is a 42 y.o. female patient who presents to office for evaluation of Right> Left ankle greater than foot pain. Patient reports that physical therapy seems to have helped as well as her orthotics however states that she wanted to see me because her initial referral for Dr. Cannon Kettle and not Dr. Carman Ching states that she had MRIs done and performed and when she was called to discuss the results the doctor told her that surgery was the only option however she decided to go against this recommendation and to do therapy which seems to be helping.  Patient states that she does not want to undergo surgery for this however does want to continue with physical therapy and to see if there is any other options to help with her symptoms since they are improving with therapy.  Patient does admit that there is still some pain on the right greater than left and that pain sometimes worsens depending on the level of her activity states that other than getting orthotics and physical therapy has not tried any other treatments to help with inflammation and swelling along the tendons.  Patient denies any changes with medical history or any new problems or issues at this time.  Review of Systems  Musculoskeletal: Positive for joint pain.  All other systems reviewed and are negative.   Patient Active Problem List   Diagnosis Date Noted  . Dysuria 04/07/2018  . Essential (primary) hypertension 04/07/2018  . Keloid 04/07/2018  . Other long term (current) drug therapy 04/07/2018  . Shortness of breath 04/07/2018  . Type 2 diabetes mellitus with hyperglycemia ([place]) 04/07/2018  . GERD with apnea 06/05/2017  . Snoring 06/05/2017  . Sleep deprivation 06/05/2017  . Hypersomnia with sleep apnea 06/05/2017  . Body mass index (bmi) 38.0-38.9, adult 05/01/2017  . Symptoms involving urinary system 02/06/2017  . Pain 11/21/2016  . Seborrheic dermatitis 12/24/2012  . Fibroids 02/19/2012  . Other  psoriasis 11/21/2011  . Superficial injury of skin 11/21/2011   Current Outpatient Medications on File Prior to Visit  Medication Sig Dispense Refill  . amLODipine (NORVASC) 10 MG tablet Take 10 mg by mouth daily.    Marland Kitchen BLISOVI FE 1.5/30 1.5-30 MG-MCG tablet     . ibuprofen (ADVIL,MOTRIN) 600 MG tablet Take 600 mg by mouth every 6 (six) hours as needed.    . Iron-FA-B Cmp-C-Biot-Probiotic (FUSION PLUS PO) Take 1 tablet by mouth daily.    . metFORMIN (GLUCOPHAGE) 500 MG tablet Take by mouth 2 (two) times daily with a meal.    . olmesartan (BENICAR) 40 MG tablet Take 40 mg by mouth daily.    Marland Kitchen omeprazole (PRILOSEC) 40 MG capsule     . spironolactone (ALDACTONE) 50 MG tablet Take 50 mg by mouth daily.     No current facility-administered medications on file prior to visit.    Allergies  Allergen Reactions  . Lisinopril Other (See Comments)     Objective:  General: Alert and oriented x3 in no acute distress  Dermatology: No open lesions bilateral lower extremities, no webspace macerations, no ecchymosis bilateral, all nails x 10 are well manicured.  Vascular: Dorsalis Pedis and Posterior Tibial pedal pulses 2/4, Capillary Fill Time 3 seconds, (+) pedal hair growth bilateral, no edema bilateral lower extremities, Temperature gradient within normal limits.  Neurology: Johney Maine sensation intact via light touch bilateral.  Musculoskeletal: Mild tenderness with palpation along posterior tibial tendon course at the medial ankle no pain that extends onto the foot right  greater than left with significant pes planus deformity range of motion appears to be in normal limits however there is mild decreased with dorsal flexion of the ankle consistent with equinus deformity..  Assessment and Plan: Problem List Items Addressed This Visit    None    Visit Diagnoses    PTTD (posterior tibial tendon dysfunction)    -  Primary   Relevant Medications   methylPREDNISolone (MEDROL DOSEPAK) 4 MG TBPK tablet    meloxicam (MOBIC) 15 MG tablet   Pes planus of both feet       Bilateral foot pain          -Complete examination performed -Previous MRI results reviewed -Discussed treatement options; discussed posterior tibial tendon dysfunction with tendinitis and pes planus deformity;conservative and  Surgical -Rx meloxicam and Medrol Dosepak and advised patient to closely monitor his blood sugars while on this treatment -Dispense ankle gauntlet to use on right and advised patient to continue with physical therapy and good supportive shoes and insoles and will bring her to office in 5 weeks to see if there is anything we need to add or change based on symptoms may benefit to avoid surgery from a ultrasound-guided steroid injection however at this time we will hold off on any type of injection therapy until next visit -Patient to return to office in 5 weeks or sooner if condition worsens.  Landis Martins, DPM

## 2018-07-30 ENCOUNTER — Other Ambulatory Visit: Payer: Self-pay | Admitting: Internal Medicine

## 2018-08-16 ENCOUNTER — Encounter: Payer: Self-pay | Admitting: Internal Medicine

## 2018-08-16 DIAGNOSIS — M76829 Posterior tibial tendinitis, unspecified leg: Secondary | ICD-10-CM

## 2018-08-18 ENCOUNTER — Other Ambulatory Visit: Payer: Self-pay

## 2018-08-18 ENCOUNTER — Encounter: Payer: Self-pay | Admitting: Internal Medicine

## 2018-08-18 ENCOUNTER — Ambulatory Visit: Payer: 59 | Admitting: Internal Medicine

## 2018-08-18 VITALS — BP 116/70 | HR 93 | Temp 98.4°F | Ht 65.5 in | Wt 240.4 lb

## 2018-08-18 DIAGNOSIS — Z6839 Body mass index (BMI) 39.0-39.9, adult: Secondary | ICD-10-CM

## 2018-08-18 DIAGNOSIS — I10 Essential (primary) hypertension: Secondary | ICD-10-CM | POA: Diagnosis not present

## 2018-08-18 DIAGNOSIS — E1165 Type 2 diabetes mellitus with hyperglycemia: Secondary | ICD-10-CM

## 2018-08-18 MED ORDER — METFORMIN HCL 500 MG PO TABS: 500.0000 mg | ORAL_TABLET | Freq: Two times a day (BID) | ORAL | 2 refills | Status: DC

## 2018-08-18 MED ORDER — OLMESARTAN MEDOXOMIL 40 MG PO TABS: 40.0000 mg | ORAL_TABLET | Freq: Every day | ORAL | 1 refills | Status: DC

## 2018-08-18 MED ORDER — SPIRONOLACTONE 50 MG PO TABS: 50.0000 mg | ORAL_TABLET | Freq: Every day | ORAL | 2 refills | Status: DC

## 2018-08-18 MED ORDER — OMEPRAZOLE 40 MG PO CPDR: DELAYED_RELEASE_CAPSULE | ORAL | 1 refills | Status: AC

## 2018-08-18 MED ORDER — SEMAGLUTIDE(0.25 OR 0.5MG/DOS) 2 MG/1.5ML ~~LOC~~ SOPN: 0.5000 mg | PEN_INJECTOR | SUBCUTANEOUS | 3 refills | Status: AC

## 2018-08-18 NOTE — Patient Instructions (Signed)

## 2018-08-18 NOTE — Progress Notes (Signed)
Subjective:     Patient ID: Kristen Wise , female    DOB: August 23, 1976 , 42 y.o.   MRN: [id number]   Chief Complaint  Patient presents with  . Diabetes  . Hypertension    HPI  Diabetes  She presents for her follow-up diabetic visit. She has type 2 diabetes mellitus. Her disease course has been fluctuating. There are no hypoglycemic associated symptoms. Pertinent negatives for hypoglycemia include no headaches. Pertinent negatives for diabetes include no blurred vision and no chest pain. There are no hypoglycemic complications. Risk factors for coronary artery disease include diabetes mellitus, hypertension and obesity. She participates in exercise intermittently.  Hypertension  This is a chronic problem. The current episode started more than 1 year ago. The problem is controlled. Pertinent negatives include no blurred vision, chest pain, headaches, orthopnea, palpitations or shortness of breath.     Past Medical History:  Diagnosis Date  . Diabetes mellitus without complication ([place])   . Fibroid 08/01/2011   utrerine  . Gonorrhea   . H/O psoriasis   . History of chicken pox   . History of irregular menstrual bleeding   . Hypertension      Family History  Problem Relation Age of Onset  . Diabetes Maternal Grandmother   . Breast cancer Maternal Aunt   . Breast cancer Cousin 22  . Hypertension Mother   . Diabetes Father   . Hyperlipidemia Father   . Seizures Neg Hx      Current Outpatient Medications:  .  acetaminophen (TYLENOL) 325 MG tablet, Take by mouth., Disp: , Rfl:  .  amLODipine (NORVASC) 10 MG tablet, Take 10 mg by mouth daily., Disp: , Rfl:  .  clobetasol ointment (TEMOVATE) 0.05 %, clobetasol 0.05 % topical ointment  APPLY TO SCALP 3 TIMES PER WEEK AS NEEDED. NOT TO FACE., Disp: , Rfl:  .  ibuprofen (ADVIL,MOTRIN) 600 MG tablet, Take 600 mg by mouth every 6 (six) hours as needed., Disp: , Rfl:  .  ketoconazole (NIZORAL) 2 % cream, Apply to the face  daily, Disp: , Rfl:  .  meloxicam (MOBIC) 15 MG tablet, Take 1 tablet (15 mg total) by mouth daily., Disp: 30 tablet, Rfl: 0 .  metFORMIN (GLUCOPHAGE) 500 MG tablet, Take 1 tablet (500 mg total) by mouth 2 (two) times daily with a meal., Disp: 180 tablet, Rfl: 2 .  omeprazole (PRILOSEC) 40 MG capsule, One capsule po qd prn, Disp: 90 capsule, Rfl: 1 .  Semaglutide,0.25 or 0.5MG/DOS, (OZEMPIC, 0.25 OR 0.5 MG/DOSE,) 2 MG/1.5ML SOPN, Inject 0.5 mg into the skin once a week., Disp: 1 pen, Rfl: 3 .  spironolactone (ALDACTONE) 50 MG tablet, Take 1 tablet (50 mg total) by mouth daily., Disp: 90 tablet, Rfl: 2 .  olmesartan (BENICAR) 40 MG tablet, Take 1 tablet (40 mg total) by mouth daily., Disp: 90 tablet, Rfl: 1   Allergies  Allergen Reactions  . Lisinopril Other (See Comments)     Review of Systems  Constitutional: Negative.   Eyes: Negative for blurred vision.  Respiratory: Negative.  Negative for shortness of breath.   Cardiovascular: Negative.  Negative for chest pain, palpitations and orthopnea.  Gastrointestinal: Negative.   Genitourinary: Negative.   Neurological: Negative.  Negative for headaches.  Psychiatric/Behavioral: Negative.      Today's Vitals   08/18/18 1527  BP: 116/70  Pulse: 93  Temp: 98.4 F (36.9 C)  TempSrc: Oral  SpO2: 96%  Weight: 240 lb 6.4 oz (109 kg)  Height:  5' 5.5" (1.664 m)   Body mass index is 39.4 kg/m.   Objective:  Physical Exam  Constitutional: She is oriented to person, place, and time. She appears well-developed and well-nourished.  HENT:  Head: Normocephalic and atraumatic.  Eyes: EOM are normal.  Cardiovascular: Normal rate, regular rhythm and normal heart sounds.  Pulmonary/Chest: Effort normal and breath sounds normal.  Neurological: She is alert and oriented to person, place, and time. Coordination normal.  Psychiatric: She has a normal mood and affect.  Nursing note and vitals reviewed.       Assessment And Plan:     1.  Uncontrolled type 2 diabetes mellitus with hyperglycemia ([place])  Importance of regular exercise in management of diabetes was discussed with the patient. She is encouraged to exercise no less than five days weekly.  - CMP14+EGFR - Hemoglobin A1c - Lipid Profile  2. Essential hypertension, benign  Well controlled. She will continue with current meds. She is encouraged to avoid adding salt to her foods.   3. Class 2 severe obesity due to excess calories with serious comorbidity and body mass index (BMI) of 39.0 to 39.9 in adult [facility])  She is encouraged to strive for BMI less than 32 to decrease cardiac risk. She is encouraged to incorporate more exercise into her daily routine and to avoid processed foods.   Maximino Greenland, MD

## 2018-08-29 LAB — CMP14+EGFR
ALT: 31 IU/L (ref 0–32)
AST: 23 IU/L (ref 0–40)
Albumin/Globulin Ratio: 1.4 (ref 1.2–2.2)
Albumin: 4.4 g/dL (ref 3.5–5.5)
Alkaline Phosphatase: 105 IU/L (ref 39–117)
BUN/Creatinine Ratio: 17 (ref 9–23)
BUN: 13 mg/dL (ref 6–24)
Bilirubin Total: 0.3 mg/dL (ref 0.0–1.2)
CALCIUM: 9.7 mg/dL (ref 8.7–10.2)
CHLORIDE: 104 mmol/L (ref 96–106)
CO2: 21 mmol/L (ref 20–29)
CREATININE: 0.76 mg/dL (ref 0.57–1.00)
GFR calc non Af Amer: 96 mL/min/{1.73_m2} (ref >59)
GFR, EST AFRICAN AMERICAN: 110 mL/min/{1.73_m2} (ref >59)
GLUCOSE: 136 mg/dL — AB (ref 65–99)
Globulin, Total: 3.2 g/dL (ref 1.5–4.5)
Potassium: 4.5 mmol/L (ref 3.5–5.2)
Sodium: 139 mmol/L (ref 134–144)
Total Protein: 7.6 g/dL (ref 6.0–8.5)

## 2018-08-29 LAB — LIPID PANEL
Chol/HDL Ratio: 2.6 ratio (ref 0.0–4.4)
Cholesterol, Total: 180 mg/dL (ref 100–199)
HDL: 70 mg/dL (ref >39)
LDL Calculated: 91 mg/dL (ref 0–99)
Triglycerides: 95 mg/dL (ref 0–149)
VLDL Cholesterol Cal: 19 mg/dL (ref 5–40)

## 2018-08-29 LAB — HEMOGLOBIN A1C
Est. average glucose Bld gHb Est-mCnc: 137 mg/dL
Hgb A1c MFr Bld: 6.4 % — ABNORMAL HIGH (ref 4.8–5.6)

## 2018-08-30 NOTE — Progress Notes (Signed)
Your liver and kidney fxn are nl. Your bad chol, LDL, is 91. Should be less than 70 as a diabetic. Is she willing to take chol medication? If not, we can consider supplement. I suggest you avoid fried foods, exercise no less than five days weekly and eat fish at least twice weekly.  hba1c is 6.4, this is great.

## 2018-09-02 NOTE — Progress Notes (Signed)
pls send rosuvastatin 10mg  #30/1 refill, needs chol check in six weeks

## 2018-09-09 ENCOUNTER — Ambulatory Visit: Payer: 59 | Admitting: Sports Medicine

## 2018-09-09 ENCOUNTER — Encounter: Payer: Self-pay | Admitting: Sports Medicine

## 2018-09-09 DIAGNOSIS — M79671 Pain in right foot: Secondary | ICD-10-CM | POA: Diagnosis not present

## 2018-09-09 DIAGNOSIS — M2141 Flat foot [pes planus] (acquired), right foot: Secondary | ICD-10-CM | POA: Diagnosis not present

## 2018-09-09 DIAGNOSIS — M79672 Pain in left foot: Secondary | ICD-10-CM

## 2018-09-09 DIAGNOSIS — M2142 Flat foot [pes planus] (acquired), left foot: Secondary | ICD-10-CM

## 2018-09-09 DIAGNOSIS — M76829 Posterior tibial tendinitis, unspecified leg: Secondary | ICD-10-CM | POA: Diagnosis not present

## 2018-09-09 NOTE — Progress Notes (Signed)
Subjective: Kristen Wise is a 42 y.o. female patient who returns to office for follow-up evaluation of Right> Left ankle greater than foot pain. Patient reports that the steroid really helped both her feet and the pain was completely gone however today her feet do hurt states that she has been compliant with wearing her ankle brace on the right and wearing her insoles of which she forgot to bring with her at today's visit.  Patient states that she has a few more tablets of meloxicam left but denies any new symptoms.   Patient Active Problem List   Diagnosis Date Noted  . Posterior tibial tendinitis, unspecified leg 05/14/2018  . Dysuria 04/07/2018  . Essential (primary) hypertension 04/07/2018  . Keloid 04/07/2018  . Other long term (current) drug therapy 04/07/2018  . Shortness of breath 04/07/2018  . Type 2 diabetes mellitus with hyperglycemia ([place]) 04/07/2018  . GERD with apnea 06/05/2017  . Snoring 06/05/2017  . Sleep deprivation 06/05/2017  . Hypersomnia with sleep apnea 06/05/2017  . Body mass index (bmi) 38.0-38.9, adult 05/01/2017  . Symptoms involving urinary system 02/06/2017  . Pain 11/21/2016  . Seborrheic dermatitis 12/24/2012  . Fibroids 02/19/2012  . Other psoriasis 11/21/2011  . Superficial injury of skin 11/21/2011   Current Outpatient Medications on File Prior to Visit  Medication Sig Dispense Refill  . acetaminophen (TYLENOL) 325 MG tablet Take by mouth.    Marland Kitchen amLODipine (NORVASC) 10 MG tablet Take 10 mg by mouth daily.    . clobetasol ointment (TEMOVATE) 0.05 % clobetasol 0.05 % topical ointment  APPLY TO SCALP 3 TIMES PER WEEK AS NEEDED. NOT TO FACE.    Marland Kitchen ibuprofen (ADVIL,MOTRIN) 600 MG tablet Take 600 mg by mouth every 6 (six) hours as needed.    Marland Kitchen ketoconazole (NIZORAL) 2 % cream Apply to the face daily    . meloxicam (MOBIC) 15 MG tablet Take 1 tablet (15 mg total) by mouth daily. 30 tablet 0  . metFORMIN (GLUCOPHAGE) 500 MG tablet Take 1 tablet  (500 mg total) by mouth 2 (two) times daily with a meal. 180 tablet 2  . olmesartan (BENICAR) 40 MG tablet Take 1 tablet (40 mg total) by mouth daily. 90 tablet 1  . omeprazole (PRILOSEC) 40 MG capsule One capsule po qd prn 90 capsule 1  . Semaglutide,0.25 or 0.5MG /DOS, (OZEMPIC, 0.25 OR 0.5 MG/DOSE,) 2 MG/1.5ML SOPN Inject 0.5 mg into the skin once a week. 1 pen 3  . spironolactone (ALDACTONE) 50 MG tablet Take 1 tablet (50 mg total) by mouth daily. 90 tablet 2   No current facility-administered medications on file prior to visit.    Allergies  Allergen Reactions  . Lisinopril Other (See Comments)     Objective:  General: Alert and oriented x3 in no acute distress  Dermatology: No open lesions bilateral lower extremities, no webspace macerations, no ecchymosis bilateral, all nails x 10 are well manicured.  Vascular: Dorsalis Pedis and Posterior Tibial pedal pulses 2/4, Capillary Fill Time 3 seconds, (+) pedal hair growth bilateral, no edema bilateral lower extremities, Temperature gradient within normal limits.  Neurology: Johney Maine sensation intact via light touch bilateral.  Musculoskeletal: Mild tenderness with palpation along posterior tibial tendon course at the medial ankle no pain that extends onto the foot right greater than left with significant pes planus deformity range of motion appears to be in normal limits however there is mild decreased with dorsal flexion of the ankle consistent with equinus deformity.  Assessment and Plan: Problem  List Items Addressed This Visit    None    Visit Diagnoses    PTTD (posterior tibial tendon dysfunction)    -  Primary   Pes planus of both feet       Bilateral foot pain          -Complete examination performed -Rediscussed posterior tibial tendon dysfunction with tendinitis and pes planus deformity;conservative and  Surgical -Continue with meloxicam until completed and dispensed a ankle gauntlet for the left foot as well and advised  patient to wear both to help control the mechanics of any pain around her feet and ankles especially in the setting of severe pes planus deformity -Advised rest ice elevation and patient to follow-up as scheduled for new set of custom functional foot orthotics that can help with further offloading her medial arch and taking additional stress and strain of her plantar fascia for long-term relief -Patient to return to office as scheduled  Landis Martins, DPM

## 2018-10-21 ENCOUNTER — Encounter: Payer: Self-pay | Admitting: Sports Medicine

## 2018-10-21 ENCOUNTER — Ambulatory Visit: Payer: Commercial Managed Care - PPO | Admitting: Orthotics

## 2018-10-21 ENCOUNTER — Ambulatory Visit (INDEPENDENT_AMBULATORY_CARE_PROVIDER_SITE_OTHER): Payer: Commercial Managed Care - PPO | Admitting: Sports Medicine

## 2018-10-21 DIAGNOSIS — M79671 Pain in right foot: Secondary | ICD-10-CM

## 2018-10-21 DIAGNOSIS — M76822 Posterior tibial tendinitis, left leg: Secondary | ICD-10-CM | POA: Diagnosis not present

## 2018-10-21 DIAGNOSIS — M2141 Flat foot [pes planus] (acquired), right foot: Secondary | ICD-10-CM | POA: Diagnosis not present

## 2018-10-21 DIAGNOSIS — M2142 Flat foot [pes planus] (acquired), left foot: Secondary | ICD-10-CM

## 2018-10-21 DIAGNOSIS — M76829 Posterior tibial tendinitis, unspecified leg: Secondary | ICD-10-CM

## 2018-10-21 DIAGNOSIS — M79672 Pain in left foot: Secondary | ICD-10-CM

## 2018-10-21 NOTE — Progress Notes (Signed)
Subjective: Kristen Wise is a 44 y.o. female patient who returns to office for follow-up evaluation of Right> Left ankle greater than foot pain however reports that today the left that seems like it is hurting more than her right.  Patient reports that she also picked up her orthotics today with Liliane Channel.  Patient states that she completed her meloxicam.  Denies any changes with health or any problems since last visit.   Patient Active Problem List   Diagnosis Date Noted  . Posterior tibial tendinitis, unspecified leg 05/14/2018  . Dysuria 04/07/2018  . Essential (primary) hypertension 04/07/2018  . Keloid 04/07/2018  . Other long term (current) drug therapy 04/07/2018  . Shortness of breath 04/07/2018  . Type 2 diabetes mellitus with hyperglycemia ([place]) 04/07/2018  . GERD with apnea 06/05/2017  . Snoring 06/05/2017  . Sleep deprivation 06/05/2017  . Hypersomnia with sleep apnea 06/05/2017  . Body mass index (bmi) 38.0-38.9, adult 05/01/2017  . Symptoms involving urinary system 02/06/2017  . Pain 11/21/2016  . Seborrheic dermatitis 12/24/2012  . Fibroids 02/19/2012  . Other psoriasis 11/21/2011  . Superficial injury of skin 11/21/2011   Current Outpatient Medications on File Prior to Visit  Medication Sig Dispense Refill  . acetaminophen (TYLENOL) 325 MG tablet Take by mouth.    Marland Kitchen amLODipine (NORVASC) 10 MG tablet Take 10 mg by mouth daily.    . clobetasol ointment (TEMOVATE) 0.05 % clobetasol 0.05 % topical ointment  APPLY TO SCALP 3 TIMES PER WEEK AS NEEDED. NOT TO FACE.    Marland Kitchen ibuprofen (ADVIL,MOTRIN) 600 MG tablet Take 600 mg by mouth every 6 (six) hours as needed.    Marland Kitchen ketoconazole (NIZORAL) 2 % cream Apply to the face daily    . meloxicam (MOBIC) 15 MG tablet Take 1 tablet (15 mg total) by mouth daily. 30 tablet 0  . metFORMIN (GLUCOPHAGE) 500 MG tablet Take 1 tablet (500 mg total) by mouth 2 (two) times daily with a meal. 180 tablet 2  . olmesartan (BENICAR) 40 MG  tablet Take 1 tablet (40 mg total) by mouth daily. 90 tablet 1  . omeprazole (PRILOSEC) 40 MG capsule One capsule po qd prn 90 capsule 1  . spironolactone (ALDACTONE) 50 MG tablet Take 1 tablet (50 mg total) by mouth daily. 90 tablet 2   No current facility-administered medications on file prior to visit.    Allergies  Allergen Reactions  . Lisinopril Other (See Comments)     Objective:  General: Alert and oriented x3 in no acute distress  Dermatology: No open lesions bilateral lower extremities, no webspace macerations, no ecchymosis bilateral, all nails x 10 are well manicured.  Vascular: Dorsalis Pedis and Posterior Tibial pedal pulses 2/4, Capillary Fill Time 3 seconds, (+) pedal hair growth bilateral, no edema bilateral lower extremities, Temperature gradient within normal limits.  Neurology: Johney Maine sensation intact via light touch bilateral.  Musculoskeletal: Mild tenderness with palpation along posterior tibial tendon course at the medial ankle today left greater than right with significant pes planus deformity range of motion appears to be in normal limits however there is mild decreased with dorsal flexion of the ankle consistent with equinus deformity.  Assessment and Plan: Problem List Items Addressed This Visit    None    Visit Diagnoses    PTTD (posterior tibial tendon dysfunction)    -  Primary   Pes planus of both feet       Bilateral foot pain          -  Complete examination performed -Rediscussed posterior tibial tendon dysfunction with tendinitis and pes planus deformity;conservative and  Surgical -Advised patient to wait at least 1 week before attempting to wear her orthotics at this visit since she was very tender on the left recommend patient to be placed in a short cam walker to use for 1 week and to use her brace and tennis shoe on the right and take meloxicam after 1 week if she is feeling good and she can slowly transition out of boot to custom orthotics  however if her pain is worse advised patient to return to office for steroid injection -Advised rest ice elevation and compression as needed -Patient to return to office if pain is not relieved after 1 week  Landis Martins, DPM

## 2018-10-21 NOTE — Progress Notes (Signed)
Increasing valgus RF post to 6*

## 2018-11-10 ENCOUNTER — Ambulatory Visit (INDEPENDENT_AMBULATORY_CARE_PROVIDER_SITE_OTHER): Payer: No Typology Code available for payment source | Admitting: Internal Medicine

## 2018-11-10 ENCOUNTER — Encounter: Payer: Self-pay | Admitting: Internal Medicine

## 2018-11-10 VITALS — BP 120/76 | HR 92 | Temp 97.5°F | Ht 65.5 in | Wt 241.0 lb

## 2018-11-10 DIAGNOSIS — Z712 Person consulting for explanation of examination or test findings: Secondary | ICD-10-CM

## 2018-11-10 DIAGNOSIS — R002 Palpitations: Secondary | ICD-10-CM

## 2018-11-10 DIAGNOSIS — Z6839 Body mass index (BMI) 39.0-39.9, adult: Secondary | ICD-10-CM

## 2018-11-10 DIAGNOSIS — E1165 Type 2 diabetes mellitus with hyperglycemia: Secondary | ICD-10-CM | POA: Diagnosis not present

## 2018-11-10 DIAGNOSIS — I10 Essential (primary) hypertension: Secondary | ICD-10-CM | POA: Diagnosis not present

## 2018-11-10 DIAGNOSIS — R0683 Snoring: Secondary | ICD-10-CM

## 2018-11-11 LAB — BMP8+EGFR
BUN/Creatinine Ratio: 19 (ref 9–23)
BUN: 13 mg/dL (ref 6–24)
CO2: 22 mmol/L (ref 20–29)
Calcium: 10.3 mg/dL — ABNORMAL HIGH (ref 8.7–10.2)
Chloride: 101 mmol/L (ref 96–106)
Creatinine, Ser: 0.7 mg/dL (ref 0.57–1.00)
GFR calc Af Amer: 122 mL/min/{1.73_m2} (ref >59)
GFR, EST NON AFRICAN AMERICAN: 106 mL/min/{1.73_m2} (ref >59)
Glucose: 99 mg/dL (ref 65–99)
Potassium: 4 mmol/L (ref 3.5–5.2)
Sodium: 142 mmol/L (ref 134–144)

## 2018-11-11 LAB — MAGNESIUM: Magnesium: 2.2 mg/dL (ref 1.6–2.3)

## 2018-11-11 LAB — TSH: TSH: 2.47 u[IU]/mL (ref 0.450–4.500)

## 2018-11-11 MED ORDER — SEMAGLUTIDE (1 MG/DOSE) 2 MG/1.5ML ~~LOC~~ SOPN: 1.0000 mg | PEN_INJECTOR | SUBCUTANEOUS | 1 refills | Status: DC

## 2018-11-11 NOTE — Progress Notes (Addendum)
Subjective:     Patient ID: Kristen Wise , female    DOB: 19-Nov-1975 , 43 y.o.   MRN: [id number]   Chief Complaint  Patient presents with  . Palpitations    HPI  Palpitations   This is a chronic problem. The current episode started more than 1 month ago. The problem occurs 2 to 4 times per day. The problem has been unchanged. The symptoms are aggravated by caffeine. She has tried nothing for the symptoms. Risk factors include diabetes mellitus, dyslipidemia, obesity and sedentary lifestyle.     Past Medical History:  Diagnosis Date  . Diabetes mellitus without complication ([place])   . Fibroid 08/01/2011   utrerine  . Gonorrhea   . H/O psoriasis   . History of chicken pox   . History of irregular menstrual bleeding   . Hypertension      Family History  Problem Relation Age of Onset  . Diabetes Maternal Grandmother   . Breast cancer Maternal Aunt   . Breast cancer Cousin 41  . Hypertension Mother   . Diabetes Father   . Hyperlipidemia Father   . Seizures Neg Hx      Current Outpatient Medications:  .  acetaminophen (TYLENOL) 325 MG tablet, Take by mouth., Disp: , Rfl:  .  amLODipine (NORVASC) 10 MG tablet, Take 10 mg by mouth daily., Disp: , Rfl:  .  clobetasol ointment (TEMOVATE) 0.05 %, clobetasol 0.05 % topical ointment  APPLY TO SCALP 3 TIMES PER WEEK AS NEEDED. NOT TO FACE., Disp: , Rfl:  .  ibuprofen (ADVIL,MOTRIN) 600 MG tablet, Take 600 mg by mouth every 6 (six) hours as needed., Disp: , Rfl:  .  ketoconazole (NIZORAL) 2 % cream, Apply to the face daily, Disp: , Rfl:  .  meloxicam (MOBIC) 15 MG tablet, Take 1 tablet (15 mg total) by mouth daily., Disp: 30 tablet, Rfl: 0 .  metFORMIN (GLUCOPHAGE) 500 MG tablet, Take 1 tablet (500 mg total) by mouth 2 (two) times daily with a meal., Disp: 180 tablet, Rfl: 2 .  olmesartan (BENICAR) 40 MG tablet, Take 1 tablet (40 mg total) by mouth daily., Disp: 90 tablet, Rfl: 1 .  omeprazole (PRILOSEC) 40 MG capsule,  One capsule po qd prn, Disp: 90 capsule, Rfl: 1 .  spironolactone (ALDACTONE) 50 MG tablet, Take 1 tablet (50 mg total) by mouth daily., Disp: 90 tablet, Rfl: 2   Allergies  Allergen Reactions  . Lisinopril Other (See Comments)     Review of Systems  Constitutional: Negative.   Respiratory: Negative.   Cardiovascular: Positive for palpitations.  Gastrointestinal: Negative.   Neurological: Negative.   Psychiatric/Behavioral: Negative.      Today's Vitals   11/10/18 1512  BP: 120/76  Pulse: 92  Temp: (!) 97.5 F (36.4 C)  TempSrc: Oral  Weight: 241 lb (109.3 kg)  Height: 5' 5.5" (1.664 m)   Body mass index is 39.49 kg/m.   Objective:  Physical Exam Vitals signs and nursing note reviewed.  Constitutional:      Appearance: Normal appearance. She is obese.  HENT:     Head: Normocephalic and atraumatic.  Cardiovascular:     Rate and Rhythm: Regular rhythm. Tachycardia present.     Heart sounds: Normal heart sounds.  Pulmonary:     Effort: Pulmonary effort is normal.     Breath sounds: Normal breath sounds.  Abdominal:     General: Bowel sounds are normal.     Palpations: Abdomen is soft.  Skin:    General: Skin is warm.  Neurological:     General: No focal deficit present.     Mental Status: She is alert.  Psychiatric:        Mood and Affect: Mood normal.        Behavior: Behavior normal.         Assessment And Plan:     1. Palpitations  Recurrent. I will check her electrolytes and thyroid function today. She is encouraged to stay well hydrated. Also advised to start nightly magnesium supplementation. I will also refer her to cardiology for further evaluation. She is in agreement with her treatment plan.   - EKG 12-Lead - Ambulatory referral to Cardiology - TSH - BMP8+EGFR - Magnesium  2. Class 2 severe obesity due to excess calories with serious comorbidity and body mass index (BMI) of 39.0 to 39.9 in adult [facility])  She is encouraged to strive for BMI  less than 30 to decrease cardiac risk. Importance of regular exercise was discussed with the patient   3. Uncontrolled type 2 diabetes mellitus with hyperglycemia ([place])  I would like to increase her Ozempic to 6m once weekly. Importance of regular exercise was discussed with the patient. She will rto in March 2020 for further evaluation.   4. Essential hypertension, benign  Well controlled. She will continue with current meds. She is encouraged to avoid adding salt to her foods.   5. Loud snoring  2018 HST results reviewed.  It was recommended that a split night be performed; however, this was never completed. I advised patient that this does need to be done. She is in agreement with referral back to Neuro for further testing.   - Ambulatory referral to Neurology        RMaximino Greenland MD

## 2018-11-13 ENCOUNTER — Other Ambulatory Visit: Payer: Commercial Managed Care - PPO | Admitting: Orthotics

## 2018-11-13 ENCOUNTER — Other Ambulatory Visit: Payer: Self-pay | Admitting: Sports Medicine

## 2018-11-13 DIAGNOSIS — M76829 Posterior tibial tendinitis, unspecified leg: Secondary | ICD-10-CM

## 2018-11-20 ENCOUNTER — Encounter: Payer: Self-pay | Admitting: Internal Medicine

## 2018-11-20 ENCOUNTER — Encounter: Payer: 59 | Admitting: Internal Medicine

## 2018-12-25 ENCOUNTER — Encounter: Payer: Self-pay | Admitting: Internal Medicine

## 2018-12-25 ENCOUNTER — Ambulatory Visit (INDEPENDENT_AMBULATORY_CARE_PROVIDER_SITE_OTHER): Payer: No Typology Code available for payment source | Admitting: Internal Medicine

## 2018-12-25 ENCOUNTER — Other Ambulatory Visit: Payer: Self-pay

## 2018-12-25 VITALS — BP 122/70 | HR 85 | Temp 98.3°F | Ht 66.2 in | Wt 239.4 lb

## 2018-12-25 DIAGNOSIS — I10 Essential (primary) hypertension: Secondary | ICD-10-CM

## 2018-12-25 DIAGNOSIS — E1165 Type 2 diabetes mellitus with hyperglycemia: Secondary | ICD-10-CM | POA: Diagnosis not present

## 2018-12-25 DIAGNOSIS — Z0000 Encounter for general adult medical examination without abnormal findings: Secondary | ICD-10-CM

## 2018-12-25 LAB — POCT URINALYSIS DIPSTICK
Bilirubin, UA: NEGATIVE
Glucose, UA: NEGATIVE
Ketones, UA: NEGATIVE
Leukocytes, UA: NEGATIVE
Nitrite, UA: NEGATIVE
Protein, UA: NEGATIVE
Spec Grav, UA: 1.02 (ref 1.010–1.025)
UROBILINOGEN UA: 0.2 U/dL
pH, UA: 5 (ref 5.0–8.0)

## 2018-12-25 NOTE — Patient Instructions (Signed)

## 2018-12-26 LAB — CMP14+EGFR
ALT: 25 IU/L (ref 0–32)
AST: 20 IU/L (ref 0–40)
Albumin/Globulin Ratio: 1.4 (ref 1.2–2.2)
Albumin: 4.6 g/dL (ref 3.8–4.8)
Alkaline Phosphatase: 106 IU/L (ref 39–117)
BUN/Creatinine Ratio: 24 — ABNORMAL HIGH (ref 9–23)
BUN: 19 mg/dL (ref 6–24)
Bilirubin Total: 0.3 mg/dL (ref 0.0–1.2)
CALCIUM: 9.8 mg/dL (ref 8.7–10.2)
CO2: 23 mmol/L (ref 20–29)
Chloride: 101 mmol/L (ref 96–106)
Creatinine, Ser: 0.8 mg/dL (ref 0.57–1.00)
GFR calc Af Amer: 104 mL/min/{1.73_m2} (ref >59)
GFR, EST NON AFRICAN AMERICAN: 90 mL/min/{1.73_m2} (ref >59)
Globulin, Total: 3.4 g/dL (ref 1.5–4.5)
Glucose: 97 mg/dL (ref 65–99)
Potassium: 4.6 mmol/L (ref 3.5–5.2)
Sodium: 138 mmol/L (ref 134–144)
Total Protein: 8 g/dL (ref 6.0–8.5)

## 2018-12-26 LAB — CBC
Hematocrit: 43.1 % (ref 34.0–46.6)
Hemoglobin: 13.6 g/dL (ref 11.1–15.9)
MCH: 27.9 pg (ref 26.6–33.0)
MCHC: 31.6 g/dL (ref 31.5–35.7)
MCV: 89 fL (ref 79–97)
Platelets: 302 10*3/uL (ref 150–450)
RBC: 4.87 x10E6/uL (ref 3.77–5.28)
RDW: 14 % (ref 11.7–15.4)
WBC: 9.6 10*3/uL (ref 3.4–10.8)

## 2018-12-26 LAB — HEMOGLOBIN A1C
Est. average glucose Bld gHb Est-mCnc: 137 mg/dL
Hgb A1c MFr Bld: 6.4 % — ABNORMAL HIGH (ref 4.8–5.6)

## 2018-12-29 NOTE — Progress Notes (Signed)
Subjective:     Patient ID: Kristen Wise , female    DOB: 1976/03/08 , 43 y.o.   MRN: [id number]   Chief Complaint  Patient presents with  . Annual Exam  . Diabetes  . Hypertension    HPI  She is here today for a full physical examination. She is followed by GYN for her pelvic examinations. She has no specific concerns or complaints at this time.   Diabetes  She presents for her follow-up diabetic visit. She has type 2 diabetes mellitus. There are no hypoglycemic associated symptoms. Pertinent negatives for diabetes include no blurred vision and no chest pain. There are no hypoglycemic complications. Risk factors for coronary artery disease include diabetes mellitus, dyslipidemia, obesity, hypertension and sedentary lifestyle. She is following a diabetic diet. She participates in exercise intermittently. Her breakfast blood glucose is taken between 9-10 am. Her breakfast blood glucose range is generally 110-130 mg/dl.  Hypertension  This is a chronic problem. The current episode started more than 1 year ago. The problem has been gradually improving since onset. Pertinent negatives include no blurred vision, chest pain, palpitations or shortness of breath.   She reports compliance with meds.   Past Medical History:  Diagnosis Date  . Diabetes mellitus without complication ([place])   . Fibroid 08/01/2011   utrerine  . Gonorrhea   . H/O psoriasis   . History of chicken pox   . History of irregular menstrual bleeding   . Hypertension      Family History  Problem Relation Age of Onset  . Diabetes Maternal Grandmother   . Breast cancer Maternal Aunt   . Breast cancer Cousin 41  . Hypertension Mother   . Diabetes Father   . Hyperlipidemia Father   . Seizures Neg Hx      Current Outpatient Medications:  .  acetaminophen (TYLENOL) 325 MG tablet, Take by mouth., Disp: , Rfl:  .  amLODipine (NORVASC) 10 MG tablet, Take 10 mg by mouth daily., Disp: , Rfl:  .  clobetasol  ointment (TEMOVATE) 0.05 %, clobetasol 0.05 % topical ointment  APPLY TO SCALP 3 TIMES PER WEEK AS NEEDED. NOT TO FACE., Disp: , Rfl:  .  ibuprofen (ADVIL,MOTRIN) 600 MG tablet, Take 600 mg by mouth every 6 (six) hours as needed., Disp: , Rfl:  .  meloxicam (MOBIC) 15 MG tablet, TAKE 1 TABLET(15 MG) BY MOUTH DAILY, Disp: 30 tablet, Rfl: 0 .  metFORMIN (GLUCOPHAGE) 500 MG tablet, Take 1 tablet (500 mg total) by mouth 2 (two) times daily with a meal., Disp: 180 tablet, Rfl: 2 .  olmesartan (BENICAR) 40 MG tablet, Take 1 tablet (40 mg total) by mouth daily., Disp: 90 tablet, Rfl: 1 .  omeprazole (PRILOSEC) 40 MG capsule, One capsule po qd prn, Disp: 90 capsule, Rfl: 1 .  Semaglutide, 1 MG/DOSE, (OZEMPIC, 1 MG/DOSE,) 2 MG/1.5ML SOPN, Inject 1 mg into the skin once a week., Disp: 1 pen, Rfl: 1 .  spironolactone (ALDACTONE) 50 MG tablet, Take 1 tablet (50 mg total) by mouth daily., Disp: 90 tablet, Rfl: 2   Allergies  Allergen Reactions  . Lisinopril Other (See Comments)      Patient's last menstrual period was 07/24/2018..  Negative for: breast discharge, breast lump(s), breast pain and breast self exam. Associated symptoms include abnormal vaginal bleeding. Pertinent negatives include abnormal bleeding (hematology), anxiety, decreased libido, depression, difficulty falling sleep, dyspareunia, history of infertility, nocturia, sexual dysfunction, sleep disturbances, urinary incontinence, urinary urgency, vaginal discharge and vaginal  itching. Diet regular.The patient states her exercise level is  intermittent.  . The patient's tobacco use is:  Social History   Tobacco Use  Smoking Status Never Smoker  Smokeless Tobacco Never Used  . She has been exposed to passive smoke. The patient's alcohol use is:  Social History   Substance and Sexual Activity  Alcohol Use No    Review of Systems  Constitutional: Negative.   HENT: Negative.   Eyes: Negative.  Negative for blurred vision.  Respiratory:  Negative.  Negative for shortness of breath.   Cardiovascular: Negative.  Negative for chest pain and palpitations.  Gastrointestinal: Negative.   Endocrine: Negative.   Genitourinary: Negative.   Musculoskeletal: Negative.   Skin: Negative.   Allergic/Immunologic: Negative.   Neurological: Negative.   Hematological: Negative.   Psychiatric/Behavioral: Negative.      Today's Vitals   12/25/18 1100  BP: 122/70  Pulse: 85  Temp: 98.3 F (36.8 C)  TempSrc: Oral  Weight: 239 lb 6.4 oz (108.6 kg)  Height: 5' 6.2" (1.681 m)   Body mass index is 38.41 kg/m.   Objective:  Physical Exam Vitals signs and nursing note reviewed.  Constitutional:      Appearance: Normal appearance. She is obese.  HENT:     Head: Normocephalic and atraumatic.     Right Ear: Tympanic membrane, ear canal and external ear normal.     Left Ear: Tympanic membrane, ear canal and external ear normal.     Nose: Nose normal.     Mouth/Throat:     Mouth: Mucous membranes are moist.     Pharynx: Oropharynx is clear.  Eyes:     Extraocular Movements: Extraocular movements intact.     Conjunctiva/sclera: Conjunctivae normal.     Pupils: Pupils are equal, round, and reactive to light.  Neck:     Musculoskeletal: Normal range of motion and neck supple.  Cardiovascular:     Rate and Rhythm: Normal rate and regular rhythm.     Pulses: Normal pulses.          Dorsalis pedis pulses are 2+ on the right side and 2+ on the left side.       Posterior tibial pulses are 2+ on the right side and 2+ on the left side.     Heart sounds: Normal heart sounds.  Pulmonary:     Effort: Pulmonary effort is normal.     Breath sounds: Normal breath sounds.  Chest:     Breasts:        Right: Normal. No swelling, bleeding, inverted nipple, mass or nipple discharge.        Left: Normal. No swelling, bleeding, inverted nipple, mass or nipple discharge.     Comments: Scattered keloids on breast b/l, scarring also  present Abdominal:     General: Bowel sounds are normal.     Palpations: Abdomen is soft.     Comments: obese  Genitourinary:    Comments: deferred Musculoskeletal: Normal range of motion.     Right foot: Normal range of motion. No deformity.     Left foot: Normal range of motion. No deformity.  Feet:     Right foot:     Protective Sensation: 5 sites tested. 5 sites sensed.     Skin integrity: Skin integrity normal.     Left foot:     Protective Sensation: 5 sites tested. 5 sites sensed.     Skin integrity: Skin integrity normal.  Skin:    General: Skin  is warm and dry.  Neurological:     General: No focal deficit present.     Mental Status: She is alert and oriented to person, place, and time.  Psychiatric:        Mood and Affect: Mood normal.        Behavior: Behavior normal.         Assessment And Plan:     1. Routine general medical examination at health care facility  A full exam was performed. She will continue with GYN for her pelvic exams. PATIENT HAS BEEN ADVISED TO GET 30-45 MINUTES REGULAR EXERCISE NO LESS THAN FOUR TO FIVE DAYS PER WEEK - BOTH WEIGHTBEARING EXERCISES AND AEROBIC ARE RECOMMENDED.  SHE IS ADVISED TO FOLLOW A HEALTHY DIET WITH AT LEAST SIX FRUITS/VEGGIES PER DAY, DECREASE INTAKE OF RED MEAT, AND TO INCREASE FISH INTAKE TO TWO DAYS PER WEEK.  MEATS/FISH SHOULD NOT BE FRIED, BAKED OR BROILED IS PREFERABLE.  I SUGGEST WEARING SPF 50 SUNSCREEN ON EXPOSED PARTS AND ESPECIALLY WHEN IN THE DIRECT SUNLIGHT FOR AN EXTENDED PERIOD OF TIME.  PLEASE AVOID FAST FOOD RESTAURANTS AND INCREASE YOUR WATER INTAKE.  - CMP14+EGFR - Hemoglobin A1c - CBC no Diff - POCT Urinalysis Dipstick (81002)  2. Uncontrolled type 2 diabetes mellitus with hyperglycemia ([facility])  Diabetic foot exam was performed. I DISCUSSED WITH THE PATIENT AT LENGTH REGARDING THE GOALS OF GLYCEMIC CONTROL AND POSSIBLE LONG-TERM COMPLICATIONS.  I  ALSO STRESSED THE IMPORTANCE OF COMPLIANCE WITH HOME  GLUCOSE MONITORING, DIETARY RESTRICTIONS INCLUDING AVOIDANCE OF SUGARY DRINKS/PROCESSED FOODS,  ALONG WITH REGULAR EXERCISE.  I  ALSO STRESSED THE IMPORTANCE OF ANNUAL EYE EXAMS, SELF FOOT CARE AND COMPLIANCE WITH OFFICE VISITS.  - POCT Urinalysis Dipstick (81002)  3. Essential hypertension, benign  Well controlled.  She will continue with current meds. She is encouraged to avoid adding salt to her foods. Importance of regular exercise was discussed with the patient. She is encouraged to aim for no less than 150 minutes per week. EKG performed, no acute changes noted.   - EKG 12-Lead - POCT Urinalysis Dipstick (02111)        Maximino Greenland, MD

## 2019-01-06 ENCOUNTER — Ambulatory Visit: Payer: Commercial Managed Care - PPO | Admitting: Cardiovascular Disease

## 2019-01-16 ENCOUNTER — Other Ambulatory Visit: Payer: Self-pay | Admitting: Internal Medicine

## 2019-01-27 ENCOUNTER — Ambulatory Visit: Payer: Commercial Managed Care - PPO | Admitting: Internal Medicine

## 2019-02-23 ENCOUNTER — Other Ambulatory Visit: Payer: Self-pay | Admitting: Internal Medicine

## 2019-03-30 ENCOUNTER — Ambulatory Visit (INDEPENDENT_AMBULATORY_CARE_PROVIDER_SITE_OTHER): Payer: No Typology Code available for payment source | Admitting: Internal Medicine

## 2019-03-30 ENCOUNTER — Other Ambulatory Visit: Payer: Self-pay

## 2019-03-30 ENCOUNTER — Encounter: Payer: Self-pay | Admitting: Internal Medicine

## 2019-03-30 VITALS — BP 126/84 | HR 91 | Temp 97.9°F | Ht 66.2 in | Wt 231.0 lb

## 2019-03-30 DIAGNOSIS — Z23 Encounter for immunization: Secondary | ICD-10-CM | POA: Diagnosis not present

## 2019-03-30 DIAGNOSIS — I10 Essential (primary) hypertension: Secondary | ICD-10-CM | POA: Diagnosis not present

## 2019-03-30 DIAGNOSIS — E1165 Type 2 diabetes mellitus with hyperglycemia: Secondary | ICD-10-CM | POA: Diagnosis not present

## 2019-03-30 DIAGNOSIS — Z6837 Body mass index (BMI) 37.0-37.9, adult: Secondary | ICD-10-CM

## 2019-03-30 MED ORDER — METFORMIN HCL 500 MG PO TABS: 500.0000 mg | ORAL_TABLET | Freq: Two times a day (BID) | ORAL | 1 refills | Status: DC

## 2019-03-30 MED ORDER — OZEMPIC (1 MG/DOSE) 2 MG/1.5ML ~~LOC~~ SOPN: 1.0000 mg | PEN_INJECTOR | SUBCUTANEOUS | 1 refills | Status: DC

## 2019-03-30 MED ORDER — AMLODIPINE BESYLATE 10 MG PO TABS: 10.0000 mg | ORAL_TABLET | Freq: Every day | ORAL | 1 refills | Status: DC

## 2019-03-30 MED ORDER — SPIRONOLACTONE 50 MG PO TABS: 50.0000 mg | ORAL_TABLET | Freq: Every day | ORAL | 1 refills | Status: DC

## 2019-03-30 MED ORDER — OLMESARTAN MEDOXOMIL 40 MG PO TABS: ORAL_TABLET | ORAL | 1 refills | Status: DC

## 2019-03-30 MED ORDER — TETANUS-DIPHTH-ACELL PERTUSSIS 5-2.5-18.5 LF-MCG/0.5 IM SUSP
0.5000 mL | Freq: Once | INTRAMUSCULAR | Status: AC
  Administered 2019-03-30: 0.5 mL via INTRAMUSCULAR

## 2019-03-30 NOTE — Progress Notes (Signed)
Subjective:     Patient ID: Kristen Wise , female    DOB: [birth year] , 43 y.o.   MRN: [id number]   Chief Complaint  Patient presents with  . Diabetes  . Hypertension    HPI  She is here today for f/u diabetes. She was started on Farxiga at her last visit. She did not have any issues with the medication, but states she ran out awhile ago.   Diabetes She presents for her follow-up diabetic visit. She has type 2 diabetes mellitus. Her disease course has been fluctuating. There are no hypoglycemic associated symptoms. Pertinent negatives for hypoglycemia include no headaches. Pertinent negatives for diabetes include no blurred vision and no chest pain. There are no hypoglycemic complications. Risk factors for coronary artery disease include diabetes mellitus, hypertension and obesity. She is compliant with treatment most of the time. She participates in exercise intermittently.  Hypertension This is a chronic problem. The current episode started more than 1 year ago. The problem is controlled. Pertinent negatives include no blurred vision, chest pain, headaches, orthopnea, palpitations or shortness of breath. The current treatment provides moderate improvement. Compliance problems include exercise.    She reports compliance with meds.   Past Medical History:  Diagnosis Date  . Diabetes mellitus without complication ([place])   . Fibroid 08/01/2011   utrerine  . Gonorrhea   . H/O psoriasis   . History of chicken pox   . History of irregular menstrual bleeding   . Hypertension      Family History  Problem Relation Age of Onset  . Diabetes Maternal Grandmother   . Breast cancer Maternal Aunt   . Breast cancer Cousin 37  . Hypertension Mother   . Diabetes Father   . Hyperlipidemia Father   . Seizures Neg Hx      Current Outpatient Medications:  .  acetaminophen (TYLENOL) 325 MG tablet, Take by mouth., Disp: , Rfl:  .  amLODipine (NORVASC) 10 MG tablet, Take 1 tablet  (10 mg total) by mouth daily., Disp: 90 tablet, Rfl: 1 .  clobetasol ointment (TEMOVATE) 0.05 %, clobetasol 0.05 % topical ointment  APPLY TO SCALP 3 TIMES PER WEEK AS NEEDED. NOT TO FACE., Disp: , Rfl:  .  ibuprofen (ADVIL,MOTRIN) 600 MG tablet, Take 600 mg by mouth every 6 (six) hours as needed., Disp: , Rfl:  .  meloxicam (MOBIC) 15 MG tablet, TAKE 1 TABLET(15 MG) BY MOUTH DAILY, Disp: 30 tablet, Rfl: 0 .  metFORMIN (GLUCOPHAGE) 500 MG tablet, Take 1 tablet (500 mg total) by mouth 2 (two) times daily with a meal., Disp: 180 tablet, Rfl: 1 .  olmesartan (BENICAR) 40 MG tablet, TAKE 1 TABLET(40 MG) BY MOUTH DAILY, Disp: 90 tablet, Rfl: 1 .  omeprazole (PRILOSEC) 40 MG capsule, One capsule po qd prn, Disp: 90 capsule, Rfl: 1 .  Semaglutide, 1 MG/DOSE, (OZEMPIC, 1 MG/DOSE,) 2 MG/1.5ML SOPN, Inject 1 mg into the skin once a week., Disp: 6 pen, Rfl: 1 .  spironolactone (ALDACTONE) 50 MG tablet, Take 1 tablet (50 mg total) by mouth daily., Disp: 90 tablet, Rfl: 1   Allergies  Allergen Reactions  . Lisinopril Other (See Comments)     Review of Systems  Constitutional: Negative.   Eyes: Negative for blurred vision.  Respiratory: Negative.  Negative for shortness of breath.   Cardiovascular: Negative for chest pain, palpitations and orthopnea.  Gastrointestinal: Negative.   Neurological: Negative.  Negative for headaches.  Psychiatric/Behavioral: Negative.      Today's  Vitals   03/30/19 1527  BP: 126/84  Pulse: 91  Temp: 97.9 F (36.6 C)  TempSrc: Oral  Weight: 231 lb (104.8 kg)  Height: 5' 6.2" (1.681 m)   Body mass index is 37.06 kg/m.   Objective:  Physical Exam Vitals signs and nursing note reviewed.  Constitutional:      Appearance: Normal appearance.  HENT:     Head: Normocephalic and atraumatic.  Cardiovascular:     Rate and Rhythm: Normal rate and regular rhythm.     Heart sounds: Normal heart sounds.  Pulmonary:     Effort: Pulmonary effort is normal.     Breath  sounds: Normal breath sounds.  Skin:    General: Skin is warm.  Neurological:     General: No focal deficit present.     Mental Status: She is alert.  Psychiatric:        Mood and Affect: Mood normal.        Behavior: Behavior normal.         Assessment And Plan:     1. Uncontrolled type 2 diabetes mellitus with hyperglycemia ([place])  I will check labs as listed below.  Importance of regular exercise was discussed with the patient. I will refer her for diabetic eye exam, she was not pleased with her most recent provider. She was given samples of Farxiga '5mg'$  at this visit. I will give her further directions, once her labs are available for review.   - BMP8+EGFR - Hemoglobin A1c - Testosterone, Total - Ambulatory referral to Ophthalmology  2. Essential hypertension, benign  Well controlled. She will continue with current meds. Pt advised that if we do resume Farxiga, we will likely need to cut back on the spironolactone to 1/2 pill daily. It is scored, she is advised to use pill cutter.   3. Class 2 severe obesity due to excess calories with serious comorbidity and body mass index (BMI) of 37.0 to 37.9 in adult [facility])  Importance of achieving optimal weight to decrease risk of cardiovascular disease and cancers was discussed with the patient in full detail. She is encouraged to start slowly - start with 10 minutes twice daily at least three to four days per week and to gradually build to 30 minutes five days weekly. She was given tips to incorporate more activity into her daily routine - take stairs when possible, park farther away from her job, grocery stores, etc.    4. Need for vaccination  - Tdap (BOOSTRIX) injection 0.5 mL        Maximino Greenland, MD    THE PATIENT IS ENCOURAGED TO PRACTICE SOCIAL DISTANCING DUE TO THE COVID-19 [place].

## 2019-03-31 ENCOUNTER — Other Ambulatory Visit: Payer: Self-pay | Admitting: Internal Medicine

## 2019-03-31 LAB — BMP8+EGFR
BUN/Creatinine Ratio: 17 (ref 9–23)
BUN: 14 mg/dL (ref 6–24)
CO2: 22 mmol/L (ref 20–29)
Calcium: 9.7 mg/dL (ref 8.7–10.2)
Chloride: 102 mmol/L (ref 96–106)
Creatinine, Ser: 0.82 mg/dL (ref 0.57–1.00)
GFR calc Af Amer: 100 mL/min/{1.73_m2} (ref >59)
GFR calc non Af Amer: 87 mL/min/{1.73_m2} (ref >59)
Glucose: 84 mg/dL (ref 65–99)
Potassium: 4.3 mmol/L (ref 3.5–5.2)
Sodium: 138 mmol/L (ref 134–144)

## 2019-03-31 LAB — HEMOGLOBIN A1C
Est. average glucose Bld gHb Est-mCnc: 131 mg/dL
Hgb A1c MFr Bld: 6.2 % — ABNORMAL HIGH (ref 4.8–5.6)

## 2019-03-31 LAB — TESTOSTERONE: Testosterone: 7 ng/dL — ABNORMAL LOW (ref 8–48)

## 2019-03-31 MED ORDER — FARXIGA 5 MG PO TABS: 5.0000 mg | ORAL_TABLET | Freq: Every day | ORAL | 1 refills | Status: DC

## 2019-03-31 NOTE — Progress Notes (Signed)
Hello!  Here are your lab results:  Your kidney function is normal. Your a1c is 6.2, down from last visit at 6.4. Congratulations!  Your serum testosterone level is low; however, I want to continue to follow this as we decrease the spironolactone. You may start taking 1/2 tab of spironolactone and start farxiga 10mg  once daily. I suggest you take the [place] with breakfast and the spironolactone at lunch. I would like to see you in four to six weeks for follow-up. It is very important that you stay well hydrated while on this medication.  We can stay with [place] 5mg  daily for now since this is the only dose I had. I will send in rx for you as well.    Please let me know if you have any questions. Take care!   RS

## 2019-04-02 ENCOUNTER — Telehealth: Payer: Self-pay | Admitting: Internal Medicine

## 2019-04-02 NOTE — Telephone Encounter (Signed)
FARXIGA 5MG  IS EXCLUDED FROM PLAN COVERAGE AND ADMINISTRATIVELY DENIED, PLAN WILL NOT PAY FOR MED.

## 2019-04-02 NOTE — Telephone Encounter (Signed)
PA STARTED HFS#[id number]

## 2019-04-08 ENCOUNTER — Telehealth: Payer: Self-pay

## 2019-04-08 ENCOUNTER — Other Ambulatory Visit: Payer: Self-pay

## 2019-04-08 DIAGNOSIS — E1165 Type 2 diabetes mellitus with hyperglycemia: Secondary | ICD-10-CM

## 2019-04-08 LAB — HM DIABETES EYE EXAM

## 2019-04-08 MED ORDER — JARDIANCE 10 MG PO TABS: 10.0000 mg | ORAL_TABLET | Freq: Every day | ORAL | 1 refills | Status: DC

## 2019-04-08 NOTE — Telephone Encounter (Signed)
Called pt to inform her of denial for farxiga. Provider suggests that she switches to jardiance. Insurance stated that she will also need a pa for jardiance but it should be covered after pa is done. Pt agreed and samples were placed at the front desk for her until pa is done.

## 2019-04-09 ENCOUNTER — Telehealth: Payer: Self-pay | Admitting: Internal Medicine

## 2019-04-09 ENCOUNTER — Telehealth: Payer: Self-pay

## 2019-04-09 NOTE — Telephone Encounter (Signed)
PA STARTED KEY#[id number] -JARDIANCE 10MG 

## 2019-04-09 NOTE — Telephone Encounter (Signed)
Left message. Called to inform pt of approval for jardiance. Cost to pt will be $40

## 2019-04-13 ENCOUNTER — Encounter: Payer: Self-pay | Admitting: Internal Medicine

## 2019-04-13 NOTE — Telephone Encounter (Signed)
PA APPROVED FOR JARDIANCE 10MG  UNTIL 04/08/2020

## 2019-06-04 ENCOUNTER — Other Ambulatory Visit: Payer: Self-pay | Admitting: Internal Medicine

## 2019-07-24 ENCOUNTER — Other Ambulatory Visit: Payer: Self-pay | Admitting: Internal Medicine

## 2019-07-24 DIAGNOSIS — Z1231 Encounter for screening mammogram for malignant neoplasm of breast: Secondary | ICD-10-CM

## 2019-08-03 ENCOUNTER — Other Ambulatory Visit: Payer: Self-pay

## 2019-08-03 ENCOUNTER — Ambulatory Visit: Payer: No Typology Code available for payment source | Admitting: Internal Medicine

## 2019-08-03 ENCOUNTER — Telehealth (INDEPENDENT_AMBULATORY_CARE_PROVIDER_SITE_OTHER): Payer: No Typology Code available for payment source | Admitting: Internal Medicine

## 2019-08-03 ENCOUNTER — Encounter: Payer: Self-pay | Admitting: Internal Medicine

## 2019-08-03 DIAGNOSIS — E1165 Type 2 diabetes mellitus with hyperglycemia: Secondary | ICD-10-CM | POA: Diagnosis not present

## 2019-08-03 DIAGNOSIS — R112 Nausea with vomiting, unspecified: Secondary | ICD-10-CM | POA: Diagnosis not present

## 2019-08-03 DIAGNOSIS — Z6837 Body mass index (BMI) 37.0-37.9, adult: Secondary | ICD-10-CM

## 2019-08-03 DIAGNOSIS — I10 Essential (primary) hypertension: Secondary | ICD-10-CM

## 2019-08-03 MED ORDER — ONDANSETRON 4 MG PO TBDP: 4.0000 mg | ORAL_TABLET | Freq: Three times a day (TID) | ORAL | 0 refills | Status: DC | PRN

## 2019-08-03 NOTE — Progress Notes (Addendum)
Virtual Visit via Video   This visit type was conducted due to national recommendations for restrictions regarding the COVID-19 Pandemic (e.g. social distancing) in an effort to limit this patient's exposure and mitigate transmission in our community.  Due to her co-morbid illnesses, this patient is at least at moderate risk for complications without adequate follow up.  This format is felt to be most appropriate for this patient at this time.  All issues noted in this document were discussed and addressed.  A limited physical exam was performed with this format.    This visit type was conducted due to national recommendations for restrictions regarding the COVID-19 Pandemic (e.g. social distancing) in an effort to limit this patient's exposure and mitigate transmission in our community.  Patients identity confirmed using two different identifiers.  This format is felt to be most appropriate for this patient at this time.  All issues noted in this document were discussed and addressed.  No physical exam was performed (except for noted visual exam findings with Video Visits).    Date:  08/03/2019   ID:  Shann Medal Trinkle, DOB 18-Oct-1975, MRN [id number]  Patient Location:  Home  Provider location:   Office    Chief Complaint:  "I need DM check"  History of Present Illness:    Kendle Turbin is a 43 y.o. female who presents via video conferencing for a telehealth visit today.    The patient does not have symptoms concerning for COVID-19 infection (fever, chills, cough, or new shortness of breath).   Se presents today for virtual visit. She prefers this method of contact due to COVID-19 pandemic.  She was scheduled to come in for diabetes check today, but she feels sick so she did not want to come in. She developed n/v shortly after midnight. Denies ill contacts. Thinks it is due to something she ate last night. States she cooked some ribs last night. Husband had same  meal, he is not sick. Associated sx include diarrhea, chills, no fever.     Past Medical History:  Diagnosis Date  . Diabetes mellitus without complication ([place])   . Fibroid 08/01/2011   utrerine  . Gonorrhea   . H/O psoriasis   . History of chicken pox   . History of irregular menstrual bleeding   . Hypertension    Past Surgical History:  Procedure Laterality Date  . ABDOMINAL HYSTERECTOMY  10/31/2017  . BREAST BIOPSY Right   . DILATION AND CURETTAGE OF UTERUS       Current Meds  Medication Sig  . acetaminophen (TYLENOL) 325 MG tablet Take by mouth.  Marland Kitchen amLODipine (NORVASC) 10 MG tablet Take 1 tablet (10 mg total) by mouth daily.  . clobetasol ointment (TEMOVATE) 0.05 % clobetasol 0.05 % topical ointment  APPLY TO SCALP 3 TIMES PER WEEK AS NEEDED. NOT TO FACE.  Marland Kitchen empagliflozin (JARDIANCE) 10 MG TABS tablet Take 10 mg by mouth daily.  Marland Kitchen ibuprofen (ADVIL,MOTRIN) 600 MG tablet Take 600 mg by mouth every 6 (six) hours as needed.  . meloxicam (MOBIC) 15 MG tablet TAKE 1 TABLET(15 MG) BY MOUTH DAILY  . metFORMIN (GLUCOPHAGE) 500 MG tablet Take 1 tablet (500 mg total) by mouth 2 (two) times daily with a meal.  . olmesartan (BENICAR) 40 MG tablet TAKE 1 TABLET(40 MG) BY MOUTH DAILY  . omeprazole (PRILOSEC) 40 MG capsule One capsule po qd prn  . OZEMPIC, 1 MG/DOSE, 2 MG/1.5ML SOPN INJECT '1MG'$  INTO THE SKIN ONCE A WEEK  .  spironolactone (ALDACTONE) 50 MG tablet Take 1 tablet (50 mg total) by mouth daily.     Allergies:   Lisinopril   Social History   Tobacco Use  . Smoking status: Never Smoker  . Smokeless tobacco: Never Used  Substance Use Topics  . Alcohol use: No  . Drug use: No     Family Hx: The patient's family history includes Breast cancer in her maternal aunt; Breast cancer (age of onset: 58) in her cousin; Diabetes in her father and maternal grandmother; Hyperlipidemia in her father; Hypertension in her mother. There is no history of Seizures.  ROS:   Please see  the history of present illness.    Review of Systems  Constitutional: Negative.   Respiratory: Negative.   Cardiovascular: Negative.   Gastrointestinal: Positive for nausea and vomiting.       She c/o n/v.  Her sx started shortly after midnight. Thinks it could be due to food poisoning. States she cooked ribs last night. Her husband ate same meal, he did not get sick. Also with diarrhea - stools are runny. Denies blood in stools. Denies fever. Did have chills and body aches earlier today.   Neurological: Negative.   Psychiatric/Behavioral: Negative.     All other systems reviewed and are negative.   Labs/Other Tests and Data Reviewed:    Recent Labs: 11/10/2018: Magnesium 2.2; TSH 2.470 12/25/2018: ALT 25; Hemoglobin 13.6; Platelets 302 03/30/2019: BUN 14; Creatinine, Ser 0.82; Potassium 4.3; Sodium 138   Recent Lipid Panel Lab Results  Component Value Date/Time   CHOL 180 08/29/2018 09:04 AM   TRIG 95 08/29/2018 09:04 AM   HDL 70 08/29/2018 09:04 AM   CHOLHDL 2.6 08/29/2018 09:04 AM   LDLCALC 91 08/29/2018 09:04 AM    Wt Readings from Last 3 Encounters:  03/30/19 231 lb (104.8 kg)  12/25/18 239 lb 6.4 oz (108.6 kg)  11/10/18 241 lb (109.3 kg)     Exam:    Vital Signs:  LMP 07/24/2018     Physical Exam  Constitutional: She is oriented to person, place, and time and well-developed, well-nourished, and in no distress.  HENT:  Head: Normocephalic and atraumatic.  Neck: Normal range of motion.  Pulmonary/Chest: Effort normal.  Neurological: She is alert and oriented to person, place, and time.  Psychiatric: Affect normal.  Nursing note and vitals reviewed.   ASSESSMENT & PLAN:     1. Uncontrolled type 2 diabetes mellitus with hyperglycemia ([place])  She agrees to come in next week for labwork. I will check an a1c, CMP at that time. She will continue with current meds for now. She was advised to let me know when she is about to run out of metformin, because this can be  combined with the synjardy. Also advised not to take second dose of metformin today and to skip tomorrow's dose due to acute illness.   2. Essential hypertension, benign  Chronic. She will continue with current meds. She is encouraged to avoid adding salt to her foods.   3. Class 2 severe obesity due to excess calories with serious comorbidity and body mass index (BMI) of 37.0 to 37.9 in adult [facility])  Importance of achieving optimal weight to decrease risk of cardiovascular disease and cancers was discussed with the patient in full detail. She is encouraged to start slowly - start with 10 minutes twice daily at least three to four days per week and to gradually build to 30 minutes five days weekly. She was given tips  to incorporate more activity into her daily routine - take stairs when possible, park farther away from her job, grocery stores, etc.    4. Non-intractable vomiting with nausea, unspecified vomiting type  Possible food poisoning vs. Viral gastroenteritis. She was advised to sip on dilute Gatorade, eat a bland diet, incorporate mashed/baked potato for next day or two and to stay well hydrated. Rx Zofran was sent to the pharmacy for her to take prn. She agrees to email me in a day or two to let me know how she is doing.      This is a failed video visit. She spent a lot of time trying to connect by video, I could see her, and she could see me, but we could not hear each other. I then placed a phone call to her to complete the visit.   COVID-19 Education: The signs and symptoms of COVID-19 were discussed with the patient and how to seek care for testing (follow up with PCP or arrange E-visit).  The importance of social distancing was discussed today.  Patient Risk:   After full review of this patients clinical status, I feel that they are at least moderate risk at this time.     Medication Adjustments/Labs and Tests Ordered: Current medicines are reviewed at length with the  patient today.  Concerns regarding medicines are outlined above.   Tests Ordered: Orders Placed This Encounter  Procedures  . CMP14+EGFR  . Lipid panel  . Hemoglobin A1c    Medication Changes: Meds ordered this encounter  Medications  . ondansetron (ZOFRAN ODT) 4 MG disintegrating tablet    Sig: Take 1 tablet (4 mg total) by mouth every 8 (eight) hours as needed for nausea or vomiting.    Dispense:  20 tablet    Refill:  0    Disposition:  Follow up in 4 month(s)  Signed, Maximino Greenland, MD

## 2019-08-03 NOTE — Patient Instructions (Signed)
Food Choices to Help Relieve Diarrhea, Adult When you have diarrhea, the foods you eat and your eating habits are very important. Choosing the right foods and drinks can help:  Relieve diarrhea.  Replace lost fluids and nutrients.  Prevent dehydration. What general guidelines should I follow?  Relieving diarrhea  Choose foods with less than 2 g or .07 oz. of fiber per serving.  Limit fats to less than 8 tsp (38 g or 1.34 oz.) a day.  Avoid the following: ? Foods and beverages sweetened with high-fructose corn syrup, honey, or sugar alcohols such as xylitol, sorbitol, and mannitol. ? Foods that contain a lot of fat or sugar. ? Fried, greasy, or spicy foods. ? High-fiber grains, breads, and cereals. ? Raw fruits and vegetables.  Eat foods that are rich in probiotics. These foods include dairy products such as yogurt and fermented milk products. They help increase healthy bacteria in the stomach and intestines (gastrointestinal tract, or GI tract).  If you have lactose intolerance, avoid dairy products. These may make your diarrhea worse.  Take medicine to help stop diarrhea (antidiarrheal medicine) only as told by your health care provider. Replacing nutrients  Eat small meals or snacks every 3-4 hours.  Eat bland foods, such as white rice, toast, or baked potato, until your diarrhea starts to get better. Gradually reintroduce nutrient-rich foods as tolerated or as told by your health care provider. This includes: ? Well-cooked protein foods. ? Peeled, seeded, and soft-cooked fruits and vegetables. ? Low-fat dairy products.  Take vitamin and mineral supplements as told by your health care provider. Preventing dehydration  Start by sipping water or a special solution to prevent dehydration (oral rehydration solution, ORS). Urine that is clear or pale yellow means that you are getting enough fluid.  Try to drink at least 8-10 cups of fluid each day to help replace lost  fluids.  You may add other liquids in addition to water, such as clear juice or decaffeinated sports drinks, as tolerated or as told by your health care provider.  Avoid drinks with caffeine, such as coffee, tea, or soft drinks.  Avoid alcohol. What foods are recommended?     The items listed may not be a complete list. Talk with your health care provider about what dietary choices are best for you. Grains White rice. White, French, or pita breads (fresh or toasted), including plain rolls, buns, or bagels. White pasta. Saltine, soda, or graham crackers. Pretzels. Low-fiber cereal. Cooked cereals made with water (such as cornmeal, farina, or cream cereals). Plain muffins. Matzo. Melba toast. Zwieback. Vegetables Potatoes (without the skin). Most well-cooked and canned vegetables without skins or seeds. Tender lettuce. Fruits Apple sauce. Fruits canned in juice. Cooked apricots, cherries, grapefruit, peaches, pears, or plums. Fresh bananas and cantaloupe. Meats and other protein foods Baked or boiled chicken. Eggs. Tofu. Fish. Seafood. Smooth nut butters. Ground or well-cooked tender beef, ham, veal, lamb, pork, or poultry. Dairy Plain yogurt, kefir, and unsweetened liquid yogurt. Lactose-free milk, buttermilk, skim milk, or soy milk. Low-fat or nonfat hard cheese. Beverages Water. Low-calorie sports drinks. Fruit juices without pulp. Strained tomato and vegetable juices. Decaffeinated teas. Sugar-free beverages not sweetened with sugar alcohols. Oral rehydration solutions, if approved by your health care provider. Seasoning and other foods Bouillon, broth, or soups made from recommended foods. What foods are not recommended? The items listed may not be a complete list. Talk with your health care provider about what dietary choices are best for you. Grains Whole   grain, whole wheat, bran, or rye breads, rolls, pastas, and crackers. Wild or brown rice. Whole grain or bran cereals. Barley.  Oats and oatmeal. Corn tortillas or taco shells. Granola. Popcorn. Vegetables Raw vegetables. Fried vegetables. Cabbage, broccoli, Brussels sprouts, artichokes, baked beans, beet greens, corn, kale, legumes, peas, sweet potatoes, and yams. Potato skins. Cooked spinach and cabbage. Fruits Dried fruit, including raisins and dates. Raw fruits. Stewed or dried prunes. Canned fruits with syrup. Meat and other protein foods Fried or fatty meats. Deli meats. Chunky nut butters. Nuts and seeds. Beans and lentils. Bacon. Hot dogs. Sausage. Dairy High-fat cheeses. Whole milk, chocolate milk, and beverages made with milk, such as milk shakes. Half-and-half. Cream. sour cream. Ice cream. Beverages Caffeinated beverages (such as coffee, tea, soda, or energy drinks). Alcoholic beverages. Fruit juices with pulp. Prune juice. Soft drinks sweetened with high-fructose corn syrup or sugar alcohols. High-calorie sports drinks. Fats and oils Butter. Cream sauces. Margarine. Salad oils. Plain salad dressings. Olives. Avocados. Mayonnaise. Sweets and desserts Sweet rolls, doughnuts, and sweet breads. Sugar-free desserts sweetened with sugar alcohols such as xylitol and sorbitol. Seasoning and other foods Honey. Hot sauce. Chili powder. Gravy. Cream-based or milk-based soups. Pancakes and waffles. Summary  When you have diarrhea, the foods you eat and your eating habits are very important.  Make sure you get at least 8-10 cups of fluid each day, or enough to keep your urine clear or pale yellow.  Eat bland foods and gradually reintroduce healthy, nutrient-rich foods as tolerated, or as told by your health care provider.  Avoid high-fiber, fried, greasy, or spicy foods. This information is not intended to replace advice given to you by your health care provider. Make sure you discuss any questions you have with your health care provider. Document Released: 12/22/2003 Document Revised: 01/22/2019 Document Reviewed:  09/28/2016 Elsevier Patient Education  2020 [employer]  

## 2019-08-11 ENCOUNTER — Other Ambulatory Visit: Payer: Self-pay | Admitting: Internal Medicine

## 2019-08-11 ENCOUNTER — Other Ambulatory Visit: Payer: Self-pay

## 2019-08-11 ENCOUNTER — Other Ambulatory Visit: Payer: No Typology Code available for payment source

## 2019-08-12 LAB — LIPID PANEL
Chol/HDL Ratio: 2.5 ratio (ref 0.0–4.4)
Cholesterol, Total: 174 mg/dL (ref 100–199)
HDL: 70 mg/dL (ref >39)
LDL Chol Calc (NIH): 89 mg/dL (ref 0–99)
Triglycerides: 82 mg/dL (ref 0–149)
VLDL Cholesterol Cal: 15 mg/dL (ref 5–40)

## 2019-08-12 LAB — CMP14+EGFR
ALT: 22 IU/L (ref 0–32)
AST: 17 IU/L (ref 0–40)
Albumin/Globulin Ratio: 1.3 (ref 1.2–2.2)
Albumin: 4.2 g/dL (ref 3.8–4.8)
Alkaline Phosphatase: 101 IU/L (ref 39–117)
BUN/Creatinine Ratio: 13 (ref 9–23)
BUN: 9 mg/dL (ref 6–24)
Bilirubin Total: <0.2 mg/dL (ref 0.0–1.2)
CO2: 24 mmol/L (ref 20–29)
Calcium: 9.1 mg/dL (ref 8.7–10.2)
Chloride: 102 mmol/L (ref 96–106)
Creatinine, Ser: 0.68 mg/dL (ref 0.57–1.00)
GFR calc Af Amer: 122 mL/min/{1.73_m2} (ref >59)
GFR calc non Af Amer: 106 mL/min/{1.73_m2} (ref >59)
Globulin, Total: 3.2 g/dL (ref 1.5–4.5)
Glucose: 90 mg/dL (ref 65–99)
Potassium: 4.2 mmol/L (ref 3.5–5.2)
Sodium: 140 mmol/L (ref 134–144)
Total Protein: 7.4 g/dL (ref 6.0–8.5)

## 2019-08-12 LAB — HEMOGLOBIN A1C
Est. average glucose Bld gHb Est-mCnc: 123 mg/dL
Hgb A1c MFr Bld: 5.9 % — ABNORMAL HIGH (ref 4.8–5.6)

## 2019-09-08 ENCOUNTER — Ambulatory Visit
Admission: RE | Admit: 2019-09-08 | Discharge: 2019-09-08 | Disposition: A | Discharge status: Home / Self Care | Payer: No Typology Code available for payment source | Source: Ambulatory Visit | Attending: Internal Medicine | Admitting: Internal Medicine

## 2019-09-08 ENCOUNTER — Other Ambulatory Visit: Payer: Self-pay

## 2019-09-08 DIAGNOSIS — Z1231 Encounter for screening mammogram for malignant neoplasm of breast: Secondary | ICD-10-CM

## 2019-10-12 ENCOUNTER — Other Ambulatory Visit: Payer: Self-pay

## 2019-10-12 MED ORDER — AMLODIPINE BESYLATE 10 MG PO TABS: 10.0000 mg | ORAL_TABLET | Freq: Every day | ORAL | 1 refills | Status: DC

## 2019-10-12 MED ORDER — OLMESARTAN MEDOXOMIL 40 MG PO TABS: ORAL_TABLET | ORAL | 1 refills | Status: DC

## 2019-10-12 MED ORDER — OZEMPIC (1 MG/DOSE) 2 MG/1.5ML ~~LOC~~ SOPN: 1.0000 mg | PEN_INJECTOR | SUBCUTANEOUS | 3 refills | Status: DC

## 2019-11-06 ENCOUNTER — Other Ambulatory Visit: Payer: Self-pay | Admitting: Internal Medicine

## 2019-11-15 ENCOUNTER — Other Ambulatory Visit: Payer: Self-pay | Admitting: Internal Medicine

## 2019-11-15 DIAGNOSIS — E1165 Type 2 diabetes mellitus with hyperglycemia: Secondary | ICD-10-CM

## 2019-12-14 ENCOUNTER — Telehealth: Payer: Self-pay

## 2019-12-14 NOTE — Telephone Encounter (Signed)
[id number]. OZEMPIC INJ 2/1.5ML is approved through 12/13/2020. Your patient may now fill this prescription and it will be covered.

## 2019-12-31 ENCOUNTER — Encounter: Payer: No Typology Code available for payment source | Admitting: Internal Medicine

## 2020-02-02 ENCOUNTER — Ambulatory Visit (INDEPENDENT_AMBULATORY_CARE_PROVIDER_SITE_OTHER): Payer: No Typology Code available for payment source | Admitting: Internal Medicine

## 2020-02-02 ENCOUNTER — Other Ambulatory Visit: Payer: Self-pay

## 2020-02-02 ENCOUNTER — Encounter: Payer: Self-pay | Admitting: Internal Medicine

## 2020-02-02 VITALS — BP 136/70 | HR 90 | Temp 98.1°F | Ht 66.8 in | Wt 227.0 lb

## 2020-02-02 DIAGNOSIS — I10 Essential (primary) hypertension: Secondary | ICD-10-CM

## 2020-02-02 DIAGNOSIS — E1165 Type 2 diabetes mellitus with hyperglycemia: Secondary | ICD-10-CM

## 2020-02-02 DIAGNOSIS — Z1211 Encounter for screening for malignant neoplasm of colon: Secondary | ICD-10-CM

## 2020-02-02 DIAGNOSIS — R1031 Right lower quadrant pain: Secondary | ICD-10-CM

## 2020-02-02 LAB — POCT URINALYSIS DIPSTICK
Bilirubin, UA: NEGATIVE
Glucose, UA: POSITIVE — AB
Ketones, UA: NEGATIVE
Leukocytes, UA: NEGATIVE
Nitrite, UA: NEGATIVE
Protein, UA: NEGATIVE
Spec Grav, UA: >=1.03 — AB (ref 1.010–1.025)
Urobilinogen, UA: 0.2 E.U./dL
pH, UA: 5 (ref 5.0–8.0)

## 2020-02-02 LAB — POCT UA - MICROALBUMIN

## 2020-02-02 NOTE — Progress Notes (Signed)
This visit occurred during the SARS-CoV-2 public health emergency.  Safety protocols were in place, including screening questions prior to the visit, additional usage of staff PPE, and extensive cleaning of exam room while observing appropriate contact time as indicated for disinfecting solutions.  Subjective:     Patient ID: Kristen Wise , female    DOB: [birth year] , 44 y.o.   MRN: [id number]   Chief Complaint  Patient presents with  . Abdominal Pain    HPI  She presents today for further evaluation of abdominal pain. Started 2.5 weeks ago. She is not sure what may have triggered her sx. Described as dull pain. Denies ill contacts.   Abdominal Pain This is a recurrent problem. The current episode started 1 to 4 weeks ago. The problem occurs intermittently. The problem has been gradually worsening. The pain is located in the RLQ. The pain is moderate. The quality of the pain is dull. The abdominal pain does not radiate. Associated symptoms include flatus. Pertinent negatives include no anorexia, belching, constipation, diarrhea or fever.     Past Medical History:  Diagnosis Date  . Diabetes mellitus without complication ([place])   . Fibroid 08/01/2011   utrerine  . Gonorrhea   . H/O psoriasis   . History of chicken pox   . History of irregular menstrual bleeding   . Hypertension      Family History  Problem Relation Age of Onset  . Diabetes Maternal Grandmother   . Breast cancer Maternal Aunt   . Breast cancer Cousin 40  . Hypertension Mother   . Diabetes Father   . Hyperlipidemia Father   . Seizures Neg Hx      Current Outpatient Medications:  .  acetaminophen (TYLENOL) 325 MG tablet, Take by mouth., Disp: , Rfl:  .  amLODipine (NORVASC) 10 MG tablet, Take 1 tablet (10 mg total) by mouth daily., Disp: 90 tablet, Rfl: 1 .  clobetasol ointment (TEMOVATE) 0.05 %, clobetasol 0.05 % topical ointment  APPLY TO SCALP 3 TIMES PER WEEK AS NEEDED. NOT TO FACE., Disp: ,  Rfl:  .  ibuprofen (ADVIL,MOTRIN) 600 MG tablet, Take 600 mg by mouth every 6 (six) hours as needed., Disp: , Rfl:  .  JARDIANCE 10 MG TABS tablet, TAKE 1 TABLET BY MOUTH DAILY, Disp: 30 tablet, Rfl: 1 .  meloxicam (MOBIC) 15 MG tablet, TAKE 1 TABLET(15 MG) BY MOUTH DAILY, Disp: 30 tablet, Rfl: 0 .  metFORMIN (GLUCOPHAGE) 500 MG tablet, Take 1 tablet (500 mg total) by mouth 2 (two) times daily with a meal., Disp: 180 tablet, Rfl: 1 .  olmesartan (BENICAR) 40 MG tablet, TAKE 1 TABLET(40 MG) BY MOUTH DAILY, Disp: 90 tablet, Rfl: 1 .  omeprazole (PRILOSEC) 40 MG capsule, One capsule po qd prn, Disp: 90 capsule, Rfl: 1 .  ondansetron (ZOFRAN ODT) 4 MG disintegrating tablet, Take 1 tablet (4 mg total) by mouth every 8 (eight) hours as needed for nausea or vomiting., Disp: 20 tablet, Rfl: 0 .  Semaglutide, 1 MG/DOSE, (OZEMPIC, 1 MG/DOSE,) 2 MG/1.5ML SOPN, Inject 1 mg into the skin once a week., Disp: 3 mL, Rfl: 3 .  spironolactone (ALDACTONE) 50 MG tablet, TAKE 1 TABLET(50 MG) BY MOUTH DAILY, Disp: 90 tablet, Rfl: 1   Allergies  Allergen Reactions  . Lisinopril Other (See Comments)     Review of Systems  Constitutional: Negative.  Negative for fever.  Respiratory: Negative.   Cardiovascular: Negative.   Gastrointestinal: Positive for abdominal pain and flatus. Negative  for anorexia, constipation and diarrhea.  Neurological: Negative.   Psychiatric/Behavioral: Negative.      Today's Vitals   02/02/20 0954  BP: 136/70  Pulse: 90  Temp: 98.1 F (36.7 C)  TempSrc: Oral  Weight: 227 lb (103 kg)  Height: 5' 6.8" (1.697 m)   Body mass index is 35.77 kg/m.   Objective:  Physical Exam Vitals and nursing note reviewed.  Constitutional:      Appearance: Normal appearance. She is obese.  HENT:     Head: Normocephalic and atraumatic.  Cardiovascular:     Rate and Rhythm: Normal rate and regular rhythm.     Heart sounds: Normal heart sounds.  Pulmonary:     Effort: Pulmonary effort is  normal.     Breath sounds: Normal breath sounds.  Abdominal:     General: Abdomen is protuberant. A surgical scar is present. Bowel sounds are normal. There is no abdominal bruit. There are no signs of injury.     Palpations: Abdomen is soft.     Comments: Obese, rounded. No guarding. NO rebound.   Skin:    General: Skin is warm.  Neurological:     General: No focal deficit present.     Mental Status: She is alert.  Psychiatric:        Mood and Affect: Mood normal.        Behavior: Behavior normal.         Assessment And Plan:   1. RLQ abdominal pain  I will check labs as listed below. I will also refer her for CT abd/pelvis.  She verbalizes understanding of treatment plan.   - CMP14+EGFR - CBC with Diff - CT ABDOMEN PELVIS W CONTRAST; Future  2. Uncontrolled type 2 diabetes mellitus with hyperglycemia ([place])   I will check labs as listed below. Advised to stop Ozempic for now. If her abdominal sx improve, then I will need to stop this medication. I will then incresae dose of Jardiance to 38m daily.   - Hemoglobin A1c - POCT Urinalysis Dipstick (81002) - POCT UA - Microalbumin  3. Essential hypertension, benign  Chronic, fair control. She will continue with current meds for now. She is encouraged to avoid adding salt to her foods.   4. Special screening for malignant neoplasm of colon  I will refer her to GI for CRC screening.   - Ambulatory referral to Gastroenterology   RMaximino Greenland MD    THE PATIENT IS ENCOURAGED TO PRACTICE SOCIAL DISTANCING DUE TO THE COVID-19 PANDEMIC.

## 2020-02-02 NOTE — Patient Instructions (Signed)

## 2020-02-03 ENCOUNTER — Encounter: Payer: Self-pay | Admitting: Internal Medicine

## 2020-02-03 LAB — CBC WITH DIFFERENTIAL/PLATELET
Basophils Absolute: 0.1 10*3/uL (ref 0.0–0.2)
Basos: 1 %
EOS (ABSOLUTE): 0 10*3/uL (ref 0.0–0.4)
Eos: 0 %
Hematocrit: 43.7 % (ref 34.0–46.6)
Hemoglobin: 14.3 g/dL (ref 11.1–15.9)
Immature Grans (Abs): 0 10*3/uL (ref 0.0–0.1)
Immature Granulocytes: 0 %
Lymphocytes Absolute: 2.6 10*3/uL (ref 0.7–3.1)
Lymphs: 26 %
MCH: 27.8 pg (ref 26.6–33.0)
MCHC: 32.7 g/dL (ref 31.5–35.7)
MCV: 85 fL (ref 79–97)
Monocytes Absolute: 0.6 10*3/uL (ref 0.1–0.9)
Monocytes: 6 %
Neutrophils Absolute: 6.6 10*3/uL (ref 1.4–7.0)
Neutrophils: 67 %
Platelets: 282 10*3/uL (ref 150–450)
RBC: 5.14 x10E6/uL (ref 3.77–5.28)
RDW: 13.3 % (ref 11.7–15.4)
WBC: 9.9 10*3/uL (ref 3.4–10.8)

## 2020-02-03 LAB — CMP14+EGFR
ALT: 14 IU/L (ref 0–32)
AST: 16 IU/L (ref 0–40)
Albumin/Globulin Ratio: 1.6 (ref 1.2–2.2)
Albumin: 4.6 g/dL (ref 3.8–4.8)
Alkaline Phosphatase: 112 IU/L (ref 39–117)
BUN/Creatinine Ratio: 14 (ref 9–23)
BUN: 11 mg/dL (ref 6–24)
Bilirubin Total: 0.4 mg/dL (ref 0.0–1.2)
CO2: 21 mmol/L (ref 20–29)
Calcium: 9.7 mg/dL (ref 8.7–10.2)
Chloride: 100 mmol/L (ref 96–106)
Creatinine, Ser: 0.77 mg/dL (ref 0.57–1.00)
GFR calc Af Amer: 107 mL/min/{1.73_m2} (ref >59)
GFR calc non Af Amer: 93 mL/min/{1.73_m2} (ref >59)
Globulin, Total: 2.8 g/dL (ref 1.5–4.5)
Glucose: 80 mg/dL (ref 65–99)
Potassium: 4.3 mmol/L (ref 3.5–5.2)
Sodium: 138 mmol/L (ref 134–144)
Total Protein: 7.4 g/dL (ref 6.0–8.5)

## 2020-02-03 LAB — HEMOGLOBIN A1C
Est. average glucose Bld gHb Est-mCnc: 126 mg/dL
Hgb A1c MFr Bld: 6 % — ABNORMAL HIGH (ref 4.8–5.6)

## 2020-02-04 ENCOUNTER — Other Ambulatory Visit: Payer: Self-pay | Admitting: Internal Medicine

## 2020-02-04 ENCOUNTER — Encounter: Payer: Self-pay | Admitting: Internal Medicine

## 2020-02-04 DIAGNOSIS — E1165 Type 2 diabetes mellitus with hyperglycemia: Secondary | ICD-10-CM

## 2020-02-07 ENCOUNTER — Encounter: Payer: Self-pay | Admitting: Internal Medicine

## 2020-02-08 ENCOUNTER — Encounter: Payer: No Typology Code available for payment source | Admitting: Internal Medicine

## 2020-02-08 ENCOUNTER — Encounter: Payer: Self-pay | Admitting: Internal Medicine

## 2020-02-12 ENCOUNTER — Ambulatory Visit
Admission: RE | Admit: 2020-02-12 | Discharge: 2020-02-12 | Disposition: A | Discharge status: Home / Self Care | Payer: No Typology Code available for payment source | Source: Ambulatory Visit | Attending: Internal Medicine | Admitting: Internal Medicine

## 2020-02-12 ENCOUNTER — Other Ambulatory Visit: Payer: Self-pay

## 2020-02-12 DIAGNOSIS — R1031 Right lower quadrant pain: Secondary | ICD-10-CM | POA: Insufficient documentation

## 2020-02-12 MED ORDER — IOHEXOL 300 MG/ML  SOLN
100.0000 mL | Freq: Once | INTRAMUSCULAR | Status: AC | PRN
  Administered 2020-02-12: 100 mL via INTRAVENOUS

## 2020-03-08 ENCOUNTER — Ambulatory Visit: Payer: No Typology Code available for payment source | Admitting: Gastroenterology

## 2020-03-19 ENCOUNTER — Other Ambulatory Visit: Payer: Self-pay | Admitting: Internal Medicine

## 2020-04-06 ENCOUNTER — Encounter: Payer: Self-pay | Admitting: Internal Medicine

## 2020-04-18 ENCOUNTER — Other Ambulatory Visit: Payer: Self-pay | Admitting: Internal Medicine

## 2020-04-18 DIAGNOSIS — E1165 Type 2 diabetes mellitus with hyperglycemia: Secondary | ICD-10-CM

## 2020-05-18 ENCOUNTER — Other Ambulatory Visit: Payer: Self-pay | Admitting: Internal Medicine

## 2020-05-31 ENCOUNTER — Ambulatory Visit (INDEPENDENT_AMBULATORY_CARE_PROVIDER_SITE_OTHER): Payer: No Typology Code available for payment source | Admitting: Internal Medicine

## 2020-05-31 ENCOUNTER — Encounter: Payer: Self-pay | Admitting: Internal Medicine

## 2020-05-31 ENCOUNTER — Other Ambulatory Visit: Payer: Self-pay

## 2020-05-31 VITALS — BP 114/62 | HR 83 | Temp 98.9°F | Ht 66.8 in | Wt 229.4 lb

## 2020-05-31 DIAGNOSIS — Z23 Encounter for immunization: Secondary | ICD-10-CM

## 2020-05-31 DIAGNOSIS — Z6839 Body mass index (BMI) 39.0-39.9, adult: Secondary | ICD-10-CM

## 2020-05-31 DIAGNOSIS — E1165 Type 2 diabetes mellitus with hyperglycemia: Secondary | ICD-10-CM

## 2020-05-31 DIAGNOSIS — I10 Essential (primary) hypertension: Secondary | ICD-10-CM

## 2020-05-31 DIAGNOSIS — Z0000 Encounter for general adult medical examination without abnormal findings: Secondary | ICD-10-CM | POA: Diagnosis not present

## 2020-05-31 LAB — POCT URINALYSIS DIPSTICK
Bilirubin, UA: NEGATIVE
Blood, UA: NEGATIVE
Glucose, UA: POSITIVE — AB
Ketones, UA: NEGATIVE
Leukocytes, UA: NEGATIVE
Nitrite, UA: NEGATIVE
Protein, UA: NEGATIVE
Spec Grav, UA: >=1.03 — AB (ref 1.010–1.025)
Urobilinogen, UA: 0.2 E.U./dL
pH, UA: 5.5 (ref 5.0–8.0)

## 2020-05-31 LAB — POCT UA - MICROALBUMIN
Albumin/Creatinine Ratio, Urine, POC: 30
Creatinine, POC: 300 mg/dL
Microalbumin Ur, POC: 10 mg/L

## 2020-05-31 NOTE — Patient Instructions (Addendum)
Chiropractor - Dr. Tito Dine Rodriguez/[facility] on [facility] Maintenance, Female Adopting a healthy lifestyle and getting preventive care are important in promoting health and wellness. Ask your health care provider about:  The right schedule for you to have regular tests and exams.  Things you can do on your own to prevent diseases and keep yourself healthy. What should I know about diet, weight, and exercise? Eat a healthy diet   Eat a diet that includes plenty of vegetables, fruits, low-fat dairy products, and lean protein.  Do not eat a lot of foods that are high in solid fats, added sugars, or sodium. Maintain a healthy weight Body mass index (BMI) is used to identify weight problems. It estimates body fat based on height and weight. Your health care provider can help determine your BMI and help you achieve or maintain a healthy weight. Get regular exercise Get regular exercise. This is one of the most important things you can do for your health. Most adults should:  Exercise for at least 150 minutes each week. The exercise should increase your heart rate and make you sweat (moderate-intensity exercise).  Do strengthening exercises at least twice a week. This is in addition to the moderate-intensity exercise.  Spend less time sitting. Even light physical activity can be beneficial. Watch cholesterol and blood lipids Have your blood tested for lipids and cholesterol at 46 years of age, then have this test every 5 years. Have your cholesterol levels checked more often if:  Your lipid or cholesterol levels are high.  You are older than 46 years of age.  You are at high risk for heart disease. What should I know about cancer screening? Depending on your health history and family history, you may need to have cancer screening at various ages. This may include screening for:  Breast cancer.  Cervical cancer.  Colorectal cancer.  Skin cancer.  Lung  cancer. What should I know about heart disease, diabetes, and high blood pressure? Blood pressure and heart disease  High blood pressure causes heart disease and increases the risk of stroke. This is more likely to develop in people who have high blood pressure readings, are of African descent, or are overweight.  Have your blood pressure checked: ? Every 3-5 years if you are 78-62 years of age. ? Every year if you are 74 years old or older. Diabetes Have regular diabetes screenings. This checks your fasting blood sugar level. Have the screening done:  Once every three years after age 44 if you are at a normal weight and have a low risk for diabetes.  More often and at a younger age if you are overweight or have a high risk for diabetes. What should I know about preventing infection? Hepatitis B If you have a higher risk for hepatitis B, you should be screened for this virus. Talk with your health care provider to find out if you are at risk for hepatitis B infection. Hepatitis C Testing is recommended for:  Everyone born from [birth year] through 1967.  Anyone with known risk factors for hepatitis C. Sexually transmitted infections (STIs)  Get screened for STIs, including gonorrhea and chlamydia, if: ? You are sexually active and are younger than 46 years of age. ? You are older than 46 years of age and your health care provider tells you that you are at risk for this type of infection. ? Your sexual activity has changed since you were last screened, and you are at increased risk  for chlamydia or gonorrhea. Ask your health care provider if you are at risk.  Ask your health care provider about whether you are at high risk for HIV. Your health care provider may recommend a prescription medicine to help prevent HIV infection. If you choose to take medicine to prevent HIV, you should first get tested for HIV. You should then be tested every 3 months for as long as you are taking the  medicine. Pregnancy  If you are about to stop having your period (premenopausal) and you may become pregnant, seek counseling before you get pregnant.  Take 400 to 800 micrograms (mcg) of folic acid every day if you become pregnant.  Ask for birth control (contraception) if you want to prevent pregnancy. Osteoporosis and menopause Osteoporosis is a disease in which the bones lose minerals and strength with aging. This can result in bone fractures. If you are 66 years old or older, or if you are at risk for osteoporosis and fractures, ask your health care provider if you should:  Be screened for bone loss.  Take a calcium or vitamin D supplement to lower your risk of fractures.  Be given hormone replacement therapy (HRT) to treat symptoms of menopause. Follow these instructions at home: Lifestyle  Do not use any products that contain nicotine or tobacco, such as cigarettes, e-cigarettes, and chewing tobacco. If you need help quitting, ask your health care provider.  Do not use street drugs.  Do not share needles.  Ask your health care provider for help if you need support or information about quitting drugs. Alcohol use  Do not drink alcohol if: ? Your health care provider tells you not to drink. ? You are pregnant, may be pregnant, or are planning to become pregnant.  If you drink alcohol: ? Limit how much you use to 0-1 drink a day. ? Limit intake if you are breastfeeding.  Be aware of how much alcohol is in your drink. In the [place] one drink equals one 12 oz bottle of beer (355 mL), one 5 oz glass of wine (148 mL), or one 1 oz glass of hard liquor (44 mL). General instructions  Schedule regular health, dental, and eye exams.  Stay current with your vaccines.  Tell your health care provider if: ? You often feel depressed. ? You have ever been abused or do not feel safe at home. Summary  Adopting a healthy lifestyle and getting preventive care are important in  promoting health and wellness.  Follow your health care provider's instructions about healthy diet, exercising, and getting tested or screened for diseases.  Follow your health care provider's instructions on monitoring your cholesterol and blood pressure. This information is not intended to replace advice given to you by your health care provider. Make sure you discuss any questions you have with your health care provider. Document Revised: 09/24/2018 Document Reviewed: 09/24/2018 Elsevier Patient Education  2020 [employer].

## 2020-05-31 NOTE — Progress Notes (Signed)
This visit occurred during the SARS-CoV-2 public health emergency.  Safety protocols were in place, including screening questions prior to the visit, additional usage of staff PPE, and extensive cleaning of exam room while observing appropriate contact time as indicated for disinfecting solutions.  Subjective:     Patient ID: Kristen Wise , female    DOB: [birth year] , 44 y.o.   MRN: [id number]   Chief Complaint  Patient presents with  . Annual Exam  . Diabetes  . Hypertension    HPI  She is here today for a full physical examination. She is followed by GYN for her pelvic examinations. She has no specific concerns or complaints at this time.   Diabetes She presents for her follow-up diabetic visit. She has type 2 diabetes mellitus. There are no hypoglycemic associated symptoms. Pertinent negatives for diabetes include no blurred vision and no chest pain. There are no hypoglycemic complications. Risk factors for coronary artery disease include diabetes mellitus, dyslipidemia, obesity, hypertension and sedentary lifestyle. She is following a diabetic diet. She participates in exercise intermittently. Her breakfast blood glucose is taken between 9-10 am. Her breakfast blood glucose range is generally 110-130 mg/dl.  Hypertension This is a chronic problem. The current episode started more than 1 year ago. The problem has been gradually improving since onset. Pertinent negatives include no blurred vision, chest pain, palpitations or shortness of breath.     Past Medical History:  Diagnosis Date  . Diabetes mellitus without complication ([place])   . Fibroid 08/01/2011   utrerine  . Gonorrhea   . H/O psoriasis   . History of chicken pox   . History of irregular menstrual bleeding   . Hypertension      Family History  Problem Relation Age of Onset  . Diabetes Maternal Grandmother   . Breast cancer Maternal Aunt   . Breast cancer Cousin 14  . Hypertension Mother   . Diabetes  Father   . Hyperlipidemia Father   . Seizures Neg Hx      Current Outpatient Medications:  .  acetaminophen (TYLENOL) 325 MG tablet, Take by mouth., Disp: , Rfl:  .  amLODipine (NORVASC) 10 MG tablet, TAKE 1 TABLET(10 MG) BY MOUTH DAILY, Disp: 90 tablet, Rfl: 1 .  clobetasol ointment (TEMOVATE) 0.05 %, clobetasol 0.05 % topical ointment  APPLY TO SCALP 3 TIMES PER WEEK AS NEEDED. NOT TO FACE., Disp: , Rfl:  .  ibuprofen (ADVIL,MOTRIN) 600 MG tablet, Take 600 mg by mouth every 6 (six) hours as needed., Disp: , Rfl:  .  JARDIANCE 10 MG TABS tablet, TAKE 1 TABLET BY MOUTH DAILY, Disp: 30 tablet, Rfl: 1 .  metFORMIN (GLUCOPHAGE) 500 MG tablet, TAKE 1 TABLET(500 MG) BY MOUTH TWICE DAILY WITH A MEAL, Disp: 180 tablet, Rfl: 1 .  olmesartan (BENICAR) 40 MG tablet, TAKE 1 TABLET(40 MG) BY MOUTH DAILY, Disp: 90 tablet, Rfl: 1 .  omeprazole (PRILOSEC) 40 MG capsule, One capsule po qd prn, Disp: 90 capsule, Rfl: 1 .  OZEMPIC, 1 MG/DOSE, 4 MG/3ML SOPN, INJECT 1 MG UNDER THE SKIN ONCE A WEEK, Disp: 3 mL, Rfl: 3 .  Semaglutide, 1 MG/DOSE, (OZEMPIC, 1 MG/DOSE,) 2 MG/1.5ML SOPN, Inject 1 mg into the skin once a week., Disp: 3 mL, Rfl: 3 .  spironolactone (ALDACTONE) 50 MG tablet, TAKE 1 TABLET(50 MG) BY MOUTH DAILY, Disp: 90 tablet, Rfl: 1 .  meloxicam (MOBIC) 15 MG tablet, TAKE 1 TABLET(15 MG) BY MOUTH DAILY (Patient not taking: Reported on 05/31/2020),  Disp: 30 tablet, Rfl: 0   Allergies  Allergen Reactions  . Lisinopril Other (See Comments)      The patient states she uses none for birth control. Last LMP was Patient's last menstrual period was 07/24/2018.. Negative for Dysmenorrhea. Negative for: breast discharge, breast lump(s), breast pain and breast self exam. Associated symptoms include abnormal vaginal bleeding. Pertinent negatives include abnormal bleeding (hematology), anxiety, decreased libido, depression, difficulty falling sleep, dyspareunia, history of infertility, nocturia, sexual  dysfunction, sleep disturbances, urinary incontinence, urinary urgency, vaginal discharge and vaginal itching. Diet regular.The patient states her exercise level is    . The patient's tobacco use is:  Social History   Tobacco Use  Smoking Status Never Smoker  Smokeless Tobacco Never Used  . She has been exposed to passive smoke. The patient's alcohol use is:  Social History   Substance and Sexual Activity  Alcohol Use No    Review of Systems  Constitutional: Negative.   HENT: Negative.   Eyes: Negative.  Negative for blurred vision.  Respiratory: Negative.  Negative for shortness of breath.   Cardiovascular: Negative.  Negative for chest pain and palpitations.  Gastrointestinal: Negative.   Endocrine: Negative.   Genitourinary: Negative.   Musculoskeletal: Negative.   Skin: Negative.   Allergic/Immunologic: Negative.   Neurological: Negative.   Hematological: Negative.   Psychiatric/Behavioral: Negative.      Today's Vitals   05/31/20 1141  BP: 114/62  Pulse: 83  Temp: 98.9 F (37.2 C)  TempSrc: Oral  Weight: 229 lb 6.4 oz (104.1 kg)  Height: 5' 6.8" (1.697 m)   Body mass index is 36.14 kg/m.  Wt Readings from Last 3 Encounters:  05/31/20 229 lb 6.4 oz (104.1 kg)  02/02/20 227 lb (103 kg)  03/30/19 231 lb (104.8 kg)   Objective:  Physical Exam Constitutional:      General: She is not in acute distress.    Appearance: Normal appearance. She is well-developed. She is obese.  HENT:     Head: Normocephalic and atraumatic.     Right Ear: Hearing, tympanic membrane, ear canal and external ear normal. There is no impacted cerumen.     Left Ear: Hearing, tympanic membrane, ear canal and external ear normal. There is no impacted cerumen.     Nose:     Comments: Deferred, masked    Mouth/Throat:     Comments: Deferred, masked Eyes:     General: Lids are normal.     Extraocular Movements: Extraocular movements intact.     Conjunctiva/sclera: Conjunctivae normal.      Pupils: Pupils are equal, round, and reactive to light.     Funduscopic exam:    Right eye: No papilledema.        Left eye: No papilledema.  Neck:     Thyroid: No thyroid mass.     Vascular: No carotid bruit.  Cardiovascular:     Rate and Rhythm: Normal rate and regular rhythm.     Pulses: Normal pulses.          Dorsalis pedis pulses are 2+ on the right side and 2+ on the left side.     Heart sounds: Normal heart sounds. No murmur heard.   Pulmonary:     Effort: Pulmonary effort is normal.     Breath sounds: Normal breath sounds.  Chest:     Breasts: Tanner Score is 5.        Right: Normal.        Left: Normal.  Comments: Healed scars.  Abdominal:     General: Bowel sounds are normal. There is no distension.     Palpations: Abdomen is soft.     Tenderness: There is no abdominal tenderness.     Comments: Rounded, soft.   Genitourinary:    Rectum: Guaiac result negative.  Musculoskeletal:        General: No swelling. Normal range of motion.     Cervical back: Full passive range of motion without pain, normal range of motion and neck supple.     Right lower leg: No edema.     Left lower leg: No edema.  Feet:     Right foot:     Protective Sensation: 5 sites tested. 5 sites sensed.     Skin integrity: Skin integrity normal.     Toenail Condition: Right toenails are normal.     Left foot:     Protective Sensation: 5 sites tested. 5 sites sensed.     Skin integrity: Skin integrity normal.     Toenail Condition: Left toenails are normal.  Skin:    General: Skin is warm and dry.     Capillary Refill: Capillary refill takes less than 2 seconds.     Comments: Keloids on anterior thorax.   Neurological:     General: No focal deficit present.     Mental Status: She is alert and oriented to person, place, and time.     Cranial Nerves: No cranial nerve deficit.     Sensory: No sensory deficit.  Psychiatric:        Mood and Affect: Mood normal.        Behavior:  Behavior normal.        Thought Content: Thought content normal.        Judgment: Judgment normal.         Assessment And Plan:     1. Routine general medical examination at a health care facility Comments: A full exam was performed. Importance of monthly self breast exams was discussed with the patient. PATIENT IS ADVISED TO GET 30-45 MINUTES REGULAR EXERCISE NO LESS THAN FOUR TO FIVE DAYS PER WEEK - BOTH WEIGHTBEARING EXERCISES AND AEROBIC ARE RECOMMENDED.  PATIENT IS ADVISED TO FOLLOW A HEALTHY DIET WITH AT LEAST SIX FRUITS/VEGGIES PER DAY, DECREASE INTAKE OF RED MEAT, AND TO INCREASE FISH INTAKE TO TWO DAYS PER WEEK.  MEATS/FISH SHOULD NOT BE FRIED, BAKED OR BROILED IS PREFERABLE.  I SUGGEST WEARING SPF 50 SUNSCREEN ON EXPOSED PARTS AND ESPECIALLY WHEN IN THE DIRECT SUNLIGHT FOR AN EXTENDED PERIOD OF TIME.  PLEASE AVOID FAST FOOD RESTAURANTS AND INCREASE YOUR WATER INTAKE.  - Hemoglobin A1c - Hepatitis C antibody - VITAMIN D 25 Hydroxy (Vit-D Deficiency, Fractures) - CMP14+EGFR - Lipid panel  2. Uncontrolled type 2 diabetes mellitus with hyperglycemia (HCC) Comments: Diabetic foot exam was performed.  I DISCUSSED WITH THE PATIENT AT LENGTH REGARDING THE GOALS OF GLYCEMIC CONTROL AND POSSIBLE LONG-TERM COMPLICATIONS.  I  ALSO STRESSED THE IMPORTANCE OF COMPLIANCE WITH HOME GLUCOSE MONITORING, DIETARY RESTRICTIONS INCLUDING AVOIDANCE OF SUGARY DRINKS/PROCESSED FOODS,  ALONG WITH REGULAR EXERCISE.  I  ALSO STRESSED THE IMPORTANCE OF ANNUAL EYE EXAMS, SELF FOOT CARE AND COMPLIANCE WITH OFFICE VISITS.  - POCT Urinalysis Dipstick (81002) - POCT UA - Microalbumin  3. Essential hypertension, benign Comments: Chronic, well controlled. She will continue with current meds. She is encouraged to avoid adding salt to her foods. EKG performed, NSR w/o acute changes. She will rto in six  months for re-evaluation.  - EKG 12-Lead  4. Class 2 severe obesity due to excess calories with serious comorbidity  and body mass index (BMI) of 39.0 to 39.9 in adult [facility])  She is encouraged to initially strive for BMI less than 30 to decrease cardiac risk. She is advised to exercise no less than 150 minutes per week.   5. Immunization due Sh was given Tdap to update her immunization history.      Patient was given opportunity to ask questions. Patient verbalized understanding of the plan and was able to repeat key elements of the plan. All questions were answered to their satisfaction.   Maximino Greenland, MD   I, Maximino Greenland, MD, have reviewed all documentation for this visit. The documentation on 05/31/20 for the exam, diagnosis, procedures, and orders are all accurate and complete.  THE PATIENT IS ENCOURAGED TO PRACTICE SOCIAL DISTANCING DUE TO THE COVID-19 PANDEMIC.

## 2020-06-01 LAB — LIPID PANEL
Chol/HDL Ratio: 2.7 ratio (ref 0.0–4.4)
Cholesterol, Total: 192 mg/dL (ref 100–199)
HDL: 71 mg/dL (ref >39)
LDL Chol Calc (NIH): 106 mg/dL — ABNORMAL HIGH (ref 0–99)
Triglycerides: 86 mg/dL (ref 0–149)
VLDL Cholesterol Cal: 15 mg/dL (ref 5–40)

## 2020-06-03 ENCOUNTER — Other Ambulatory Visit: Payer: Self-pay | Admitting: Internal Medicine

## 2020-06-03 LAB — CMP14+EGFR
ALT: 15 IU/L (ref 0–32)
AST: 11 IU/L (ref 0–40)
Albumin/Globulin Ratio: 1.4 (ref 1.2–2.2)
Albumin: 4.6 g/dL (ref 3.8–4.8)
Alkaline Phosphatase: 102 IU/L (ref 48–121)
BUN/Creatinine Ratio: 23 (ref 9–23)
BUN: 15 mg/dL (ref 6–24)
Bilirubin Total: 0.3 mg/dL (ref 0.0–1.2)
CO2: 23 mmol/L (ref 20–29)
Calcium: 9.5 mg/dL (ref 8.7–10.2)
Chloride: 100 mmol/L (ref 96–106)
Creatinine, Ser: 0.66 mg/dL (ref 0.57–1.00)
GFR calc Af Amer: 123 mL/min/{1.73_m2} (ref >59)
GFR calc non Af Amer: 106 mL/min/{1.73_m2} (ref >59)
Globulin, Total: 3.3 g/dL (ref 1.5–4.5)
Glucose: 88 mg/dL (ref 65–99)
Potassium: 3.9 mmol/L (ref 3.5–5.2)
Sodium: 136 mmol/L (ref 134–144)
Total Protein: 7.9 g/dL (ref 6.0–8.5)

## 2020-06-03 LAB — HEMOGLOBIN A1C
Est. average glucose Bld gHb Est-mCnc: 123 mg/dL
Hgb A1c MFr Bld: 5.9 % — ABNORMAL HIGH (ref 4.8–5.6)

## 2020-06-03 LAB — HEPATITIS C ANTIBODY: Hep C Virus Ab: <0.1 s/co ratio (ref 0.0–0.9)

## 2020-06-03 LAB — VITAMIN D 25 HYDROXY (VIT D DEFICIENCY, FRACTURES): Vit D, 25-Hydroxy: 19.5 ng/mL — ABNORMAL LOW (ref 30.0–100.0)

## 2020-06-03 MED ORDER — VITAMIN D (ERGOCALCIFEROL) 1.25 MG (50000 UNIT) PO CAPS: ORAL_CAPSULE | ORAL | 1 refills | Status: DC

## 2020-07-14 ENCOUNTER — Other Ambulatory Visit: Payer: Self-pay | Admitting: Internal Medicine

## 2020-07-14 DIAGNOSIS — E1165 Type 2 diabetes mellitus with hyperglycemia: Secondary | ICD-10-CM

## 2020-08-03 ENCOUNTER — Other Ambulatory Visit: Payer: Self-pay | Admitting: Internal Medicine

## 2020-08-03 DIAGNOSIS — Z1231 Encounter for screening mammogram for malignant neoplasm of breast: Secondary | ICD-10-CM

## 2020-09-12 ENCOUNTER — Other Ambulatory Visit: Payer: Self-pay

## 2020-09-12 ENCOUNTER — Ambulatory Visit
Admission: RE | Admit: 2020-09-12 | Discharge: 2020-09-12 | Disposition: A | Discharge status: Home / Self Care | Payer: No Typology Code available for payment source | Source: Ambulatory Visit | Attending: Internal Medicine | Admitting: Internal Medicine

## 2020-09-12 DIAGNOSIS — Z1231 Encounter for screening mammogram for malignant neoplasm of breast: Secondary | ICD-10-CM

## 2020-09-15 ENCOUNTER — Other Ambulatory Visit: Payer: Self-pay | Admitting: Internal Medicine

## 2020-09-15 DIAGNOSIS — E1165 Type 2 diabetes mellitus with hyperglycemia: Secondary | ICD-10-CM

## 2020-10-03 ENCOUNTER — Ambulatory Visit: Payer: No Typology Code available for payment source | Admitting: Internal Medicine

## 2020-10-24 ENCOUNTER — Other Ambulatory Visit: Payer: Self-pay | Admitting: Internal Medicine

## 2020-11-14 ENCOUNTER — Other Ambulatory Visit: Payer: Self-pay | Admitting: Internal Medicine

## 2020-11-14 DIAGNOSIS — E1165 Type 2 diabetes mellitus with hyperglycemia: Secondary | ICD-10-CM

## 2020-11-17 ENCOUNTER — Other Ambulatory Visit: Payer: Self-pay

## 2020-11-17 NOTE — Telephone Encounter (Signed)
Prior auth done for ozempic, waiting on a response from the [employer].

## 2020-12-14 ENCOUNTER — Other Ambulatory Visit: Payer: Self-pay | Admitting: Internal Medicine

## 2020-12-15 ENCOUNTER — Encounter: Payer: Self-pay | Admitting: Nurse Practitioner

## 2020-12-15 ENCOUNTER — Ambulatory Visit (INDEPENDENT_AMBULATORY_CARE_PROVIDER_SITE_OTHER): Payer: No Typology Code available for payment source | Admitting: Nurse Practitioner

## 2020-12-15 ENCOUNTER — Other Ambulatory Visit: Payer: Self-pay

## 2020-12-15 VITALS — BP 120/82 | HR 84 | Temp 98.0°F | Ht 66.8 in | Wt 231.8 lb

## 2020-12-15 DIAGNOSIS — E559 Vitamin D deficiency, unspecified: Secondary | ICD-10-CM | POA: Diagnosis not present

## 2020-12-15 DIAGNOSIS — H9313 Tinnitus, bilateral: Secondary | ICD-10-CM | POA: Diagnosis not present

## 2020-12-15 DIAGNOSIS — E1165 Type 2 diabetes mellitus with hyperglycemia: Secondary | ICD-10-CM

## 2020-12-15 DIAGNOSIS — Z6836 Body mass index (BMI) 36.0-36.9, adult: Secondary | ICD-10-CM

## 2020-12-15 DIAGNOSIS — I10 Essential (primary) hypertension: Secondary | ICD-10-CM | POA: Diagnosis not present

## 2020-12-15 DIAGNOSIS — E669 Obesity, unspecified: Secondary | ICD-10-CM

## 2020-12-15 MED ORDER — METFORMIN HCL 500 MG PO TABS: 500.0000 mg | ORAL_TABLET | Freq: Two times a day (BID) | ORAL | 1 refills | Status: DC

## 2020-12-15 MED ORDER — OZEMPIC (1 MG/DOSE) 4 MG/3ML ~~LOC~~ SOPN: 1.0000 mg | PEN_INJECTOR | SUBCUTANEOUS | 5 refills | Status: DC

## 2020-12-15 MED ORDER — EMPAGLIFLOZIN 10 MG PO TABS: 10.0000 mg | ORAL_TABLET | Freq: Every day | ORAL | 1 refills | Status: DC

## 2020-12-15 MED ORDER — VITAMIN D (ERGOCALCIFEROL) 1.25 MG (50000 UNIT) PO CAPS: ORAL_CAPSULE | ORAL | 1 refills | Status: DC

## 2020-12-15 MED ORDER — CETIRIZINE HCL 10 MG PO TABS: 10.0000 mg | ORAL_TABLET | Freq: Every day | ORAL | 2 refills | Status: AC

## 2020-12-15 NOTE — Progress Notes (Signed)
I,Yamilka Roman [employer] as a Education administrator for [employer], FNP.,have documented all relevant documentation on the behalf of Minette Brine, FNP,as directed by  Minette Brine, FNP while in the presence of Minette Brine, [place]. This visit occurred during the SARS-CoV-2 public health emergency.  Safety protocols were in place, including screening questions prior to the visit, additional usage of staff PPE, and extensive cleaning of exam room while observing appropriate contact time as indicated for disinfecting solutions.  Subjective:     Patient ID: Kristen Wise , female    DOB: 02/18/1976 , 45 y.o.   MRN: [id number]   Chief Complaint  Patient presents with  . Diabetes    HPI  Patient presents today for a f/u on her diabetes.   Wt Readings from Last 3 Encounters: 12/15/20 : 231 lb 12.8 oz (105.1 kg) 05/31/20 : 229 lb 6.4 oz (104.1 kg) 02/02/20 : 227 lb (103 kg)  She is having a ringing in her ear for the last week. Denies ear pain. At night is worse. Will put on white noise.     Past Medical History:  Diagnosis Date  . Diabetes mellitus without complication ([place])   . Fibroid 08/01/2011   utrerine  . Gonorrhea   . H/O psoriasis   . History of chicken pox   . History of irregular menstrual bleeding   . Hypertension      Family History  Problem Relation Age of Onset  . Diabetes Maternal Grandmother   . Breast cancer Maternal Aunt   . Breast cancer Cousin 47  . Hypertension Mother   . Diabetes Father   . Hyperlipidemia Father   . Seizures Neg Hx      Current Outpatient Medications:  .  acetaminophen (TYLENOL) 325 MG tablet, Take by mouth., Disp: , Rfl:  .  amLODipine (NORVASC) 10 MG tablet, TAKE 1 TABLET(10 MG) BY MOUTH DAILY, Disp: 90 tablet, Rfl: 1 .  cetirizine (ZYRTEC ALLERGY) 10 MG tablet, Take 1 tablet (10 mg total) by mouth daily., Disp: 30 tablet, Rfl: 2 .  clobetasol ointment (TEMOVATE) 0.05 %, clobetasol 0.05 % topical ointment  APPLY TO SCALP 3 TIMES  PER WEEK AS NEEDED. NOT TO FACE., Disp: , Rfl:  .  ibuprofen (ADVIL,MOTRIN) 600 MG tablet, Take 600 mg by mouth every 6 (six) hours as needed., Disp: , Rfl:  .  meloxicam (MOBIC) 15 MG tablet, TAKE 1 TABLET(15 MG) BY MOUTH DAILY, Disp: 30 tablet, Rfl: 0 .  olmesartan (BENICAR) 40 MG tablet, TAKE 1 TABLET(40 MG) BY MOUTH DAILY, Disp: 90 tablet, Rfl: 1 .  omeprazole (PRILOSEC) 40 MG capsule, One capsule po qd prn, Disp: 90 capsule, Rfl: 1 .  spironolactone (ALDACTONE) 50 MG tablet, TAKE 1 TABLET(50 MG) BY MOUTH DAILY, Disp: 90 tablet, Rfl: 1 .  empagliflozin (JARDIANCE) 10 MG TABS tablet, Take 1 tablet (10 mg total) by mouth daily., Disp: 90 tablet, Rfl: 1 .  metFORMIN (GLUCOPHAGE) 500 MG tablet, Take 1 tablet (500 mg total) by mouth 2 (two) times daily with a meal., Disp: 180 tablet, Rfl: 1 .  Semaglutide, 1 MG/DOSE, (OZEMPIC, 1 MG/DOSE,) 4 MG/3ML SOPN, Inject 1 mg into the skin once a week., Disp: 3 mL, Rfl: 5 .  Vitamin D, Ergocalciferol, (DRISDOL) 1.25 MG (50000 UNIT) CAPS capsule, One capsule po twice weekly on Tuesdays/Fridays, Disp: 24 capsule, Rfl: 1   Allergies  Allergen Reactions  . Lisinopril Other (See Comments)     Review of Systems  Constitutional: Negative.   HENT:  Positive for tinnitus (bilateral ears).   Eyes: Negative.   Respiratory: Negative.  Negative for shortness of breath.   Cardiovascular: Negative.  Negative for chest pain, palpitations and leg swelling.  Gastrointestinal: Negative.   Endocrine: Negative for polydipsia, polyphagia and polyuria.  Genitourinary: Negative.   Musculoskeletal: Negative.   Skin: Negative.   Neurological: Positive for dizziness (today).  Hematological: Negative.   Psychiatric/Behavioral: Negative.      Today's Vitals   12/15/20 1612  BP: 120/82  Pulse: 84  Temp: 98 F (36.7 C)  TempSrc: Oral  Weight: 231 lb 12.8 oz (105.1 kg)  Height: 5' 6.8" (1.697 m)  PainSc: 0-No pain   Body mass index is 36.52 kg/m.   Objective:   Physical Exam Constitutional:      General: She is not in acute distress.    Appearance: Normal appearance. She is obese.  HENT:     Right Ear: External ear normal. Tympanic membrane is bulging.     Left Ear: External ear normal. Tympanic membrane is bulging.  Cardiovascular:     Rate and Rhythm: Normal rate and regular rhythm.     Pulses: Normal pulses.          Carotid pulses are 2+ on the right side and 2+ on the left side.    Heart sounds: Normal heart sounds. No murmur heard.   Pulmonary:     Effort: Pulmonary effort is normal. No respiratory distress.     Breath sounds: Normal breath sounds. No wheezing.  Musculoskeletal:        General: Normal range of motion.     Cervical back: Normal range of motion and neck supple.  Skin:    General: Skin is warm and dry.     Capillary Refill: Capillary refill takes less than 2 seconds.  Neurological:     General: No focal deficit present.     Mental Status: She is alert and oriented to person, place, and time.     Cranial Nerves: No cranial nerve deficit.     Motor: No weakness.  Psychiatric:        Mood and Affect: Mood normal.        Behavior: Behavior normal.        Thought Content: Thought content normal.        Judgment: Judgment normal.         Assessment And Plan:     1. Essential hypertension, benign  Chronic, good control  Continue with current medications - CMP14+EGFR  2. Type 2 diabetes mellitus with hyperglycemia, without long-term current use of insulin (HCC)  Chronic, continue with current medications  Continue to avoid sugary foods and drinks  Increase physical activity - empagliflozin (JARDIANCE) 10 MG TABS tablet; Take 1 tablet (10 mg total) by mouth daily.  Dispense: 90 tablet; Refill: 1 - metFORMIN (GLUCOPHAGE) 500 MG tablet; Take 1 tablet (500 mg total) by mouth 2 (two) times daily with a meal.  Dispense: 180 tablet; Refill: 1 - Semaglutide, 1 MG/DOSE, (OZEMPIC, 1 MG/DOSE,) 4 MG/3ML SOPN; Inject  1 mg into the skin once a week.  Dispense: 3 mL; Refill: 5 - Lipid panel - CMP14+EGFR - Hemoglobin A1c  3. Vitamin D deficiency  Will check vitamin D level and supplement as needed.     Also encouraged to spend 15 minutes in the sun daily.   4. Tinnitus of both ears  Bilateral TM bulging  Will treat with cetirizine - CBC - cetirizine (ZYRTEC ALLERGY) 10 MG tablet;  Take 1 tablet (10 mg total) by mouth daily.  Dispense: 30 tablet; Refill: 2  5. Class 2 obesity with body mass index (BMI) of 36.0 to 36.9 in adult, unspecified obesity type, unspecified whether serious comorbidity present  Chronic  Discussed healthy diet and regular exercise options   Encouraged to exercise at least 150 minutes per week with 2 days of strength training    Patient was given opportunity to ask questions. Patient verbalized understanding of the plan and was able to repeat key elements of the plan. All questions were answered to their satisfaction.  Minette Brine, FNP   I, Minette Brine, FNP, have reviewed all documentation for this visit. The documentation on 03//22 for the exam, diagnosis, procedures, and orders are all accurate and complete.   THE PATIENT IS ENCOURAGED TO PRACTICE SOCIAL DISTANCING DUE TO THE COVID-19 PANDEMIC.

## 2020-12-16 LAB — CMP14+EGFR
ALT: 18 IU/L (ref 0–32)
AST: 16 IU/L (ref 0–40)
Albumin/Globulin Ratio: 1.3 (ref 1.2–2.2)
Albumin: 4.3 g/dL (ref 3.8–4.8)
Alkaline Phosphatase: 103 IU/L (ref 44–121)
BUN/Creatinine Ratio: 21 (ref 9–23)
BUN: 18 mg/dL (ref 6–24)
Bilirubin Total: 0.2 mg/dL (ref 0.0–1.2)
CO2: 22 mmol/L (ref 20–29)
Calcium: 10.2 mg/dL (ref 8.7–10.2)
Chloride: 99 mmol/L (ref 96–106)
Creatinine, Ser: 0.86 mg/dL (ref 0.57–1.00)
Globulin, Total: 3.4 g/dL (ref 1.5–4.5)
Glucose: 96 mg/dL (ref 65–99)
Potassium: 4.4 mmol/L (ref 3.5–5.2)
Sodium: 138 mmol/L (ref 134–144)
Total Protein: 7.7 g/dL (ref 6.0–8.5)
eGFR: 84 mL/min/{1.73_m2} (ref >59)

## 2020-12-16 LAB — HEMOGLOBIN A1C
Est. average glucose Bld gHb Est-mCnc: 131 mg/dL
Hgb A1c MFr Bld: 6.2 % — ABNORMAL HIGH (ref 4.8–5.6)

## 2020-12-16 LAB — LIPID PANEL
Chol/HDL Ratio: 2.4 ratio (ref 0.0–4.4)
Cholesterol, Total: 200 mg/dL — ABNORMAL HIGH (ref 100–199)
HDL: 82 mg/dL (ref >39)
LDL Chol Calc (NIH): 102 mg/dL — ABNORMAL HIGH (ref 0–99)
Triglycerides: 93 mg/dL (ref 0–149)
VLDL Cholesterol Cal: 16 mg/dL (ref 5–40)

## 2020-12-16 LAB — CBC
Hematocrit: 44.5 % (ref 34.0–46.6)
Hemoglobin: 14.5 g/dL (ref 11.1–15.9)
MCH: 27.8 pg (ref 26.6–33.0)
MCHC: 32.6 g/dL (ref 31.5–35.7)
MCV: 85 fL (ref 79–97)
Platelets: 329 10*3/uL (ref 150–450)
RBC: 5.21 x10E6/uL (ref 3.77–5.28)
RDW: 13.8 % (ref 11.7–15.4)
WBC: 10 10*3/uL (ref 3.4–10.8)

## 2020-12-20 ENCOUNTER — Encounter: Payer: Self-pay | Admitting: Internal Medicine

## 2021-01-03 DIAGNOSIS — H9313 Tinnitus, bilateral: Secondary | ICD-10-CM | POA: Insufficient documentation

## 2021-01-08 ENCOUNTER — Encounter: Payer: Self-pay | Admitting: Nurse Practitioner

## 2021-02-20 LAB — HM COLONOSCOPY

## 2021-05-24 LAB — HM DIABETES EYE EXAM

## 2021-06-02 ENCOUNTER — Encounter: Payer: Self-pay | Admitting: Internal Medicine

## 2021-06-07 ENCOUNTER — Encounter: Payer: Self-pay | Admitting: Internal Medicine

## 2021-06-07 ENCOUNTER — Other Ambulatory Visit: Payer: Self-pay

## 2021-06-07 ENCOUNTER — Ambulatory Visit (INDEPENDENT_AMBULATORY_CARE_PROVIDER_SITE_OTHER): Payer: No Typology Code available for payment source | Admitting: Internal Medicine

## 2021-06-07 VITALS — BP 118/70 | HR 82 | Temp 97.8°F | Ht 66.0 in | Wt 237.4 lb

## 2021-06-07 DIAGNOSIS — E1165 Type 2 diabetes mellitus with hyperglycemia: Secondary | ICD-10-CM | POA: Diagnosis not present

## 2021-06-07 DIAGNOSIS — Z0001 Encounter for general adult medical examination with abnormal findings: Secondary | ICD-10-CM | POA: Diagnosis not present

## 2021-06-07 DIAGNOSIS — I10 Essential (primary) hypertension: Secondary | ICD-10-CM | POA: Diagnosis not present

## 2021-06-07 DIAGNOSIS — Z0000 Encounter for general adult medical examination without abnormal findings: Secondary | ICD-10-CM

## 2021-06-07 DIAGNOSIS — E559 Vitamin D deficiency, unspecified: Secondary | ICD-10-CM

## 2021-06-07 DIAGNOSIS — Z6838 Body mass index (BMI) 38.0-38.9, adult: Secondary | ICD-10-CM

## 2021-06-07 DIAGNOSIS — Z1211 Encounter for screening for malignant neoplasm of colon: Secondary | ICD-10-CM

## 2021-06-07 LAB — POCT UA - MICROALBUMIN
Albumin/Creatinine Ratio, Urine, POC: <30
Creatinine, POC: 200 mg/dL
Microalbumin Ur, POC: 10 mg/L

## 2021-06-07 LAB — POCT URINALYSIS DIPSTICK
Bilirubin, UA: NEGATIVE
Glucose, UA: POSITIVE — AB
Ketones, UA: NEGATIVE
Nitrite, UA: NEGATIVE
Protein, UA: NEGATIVE
Spec Grav, UA: 1.015 (ref 1.010–1.025)
Urobilinogen, UA: 0.2 E.U./dL
pH, UA: 7.5 (ref 5.0–8.0)

## 2021-06-07 NOTE — Patient Instructions (Signed)
Health Maintenance, Female Adopting a healthy lifestyle and getting preventive care are important in promoting health and wellness. Ask your health care provider about: The right schedule for you to have regular tests and exams. Things you can do on your own to prevent diseases and keep yourself healthy. What should I know about diet, weight, and exercise? Eat a healthy diet  Eat a diet that includes plenty of vegetables, fruits, low-fat dairy products, and lean protein. Do not eat a lot of foods that are high in solid fats, added sugars, or sodium.  Maintain a healthy weight Body mass index (BMI) is used to identify weight problems. It estimates body fat based on height and weight. Your health care provider can help determineyour BMI and help you achieve or maintain a healthy weight. Get regular exercise Get regular exercise. This is one of the most important things you can do for your health. Most adults should: Exercise for at least 150 minutes each week. The exercise should increase your heart rate and make you sweat (moderate-intensity exercise). Do strengthening exercises at least twice a week. This is in addition to the moderate-intensity exercise. Spend less time sitting. Even light physical activity can be beneficial. Watch cholesterol and blood lipids Have your blood tested for lipids and cholesterol at 47 years of age, then havethis test every 5 years. Have your cholesterol levels checked more often if: Your lipid or cholesterol levels are high. You are older than 47 years of age. You are at high risk for heart disease. What should I know about cancer screening? Depending on your health history and family history, you may need to have cancer screening at various ages. This may include screening for: Breast cancer. Cervical cancer. Colorectal cancer. Skin cancer. Lung cancer. What should I know about heart disease, diabetes, and high blood pressure? Blood pressure and heart  disease High blood pressure causes heart disease and increases the risk of stroke. This is more likely to develop in people who have high blood pressure readings, are of African descent, or are overweight. Have your blood pressure checked: Every 3-5 years if you are 18-39 years of age. Every year if you are 40 years old or older. Diabetes Have regular diabetes screenings. This checks your fasting blood sugar level. Have the screening done: Once every three years after age 40 if you are at a normal weight and have a low risk for diabetes. More often and at a younger age if you are overweight or have a high risk for diabetes. What should I know about preventing infection? Hepatitis B If you have a higher risk for hepatitis B, you should be screened for this virus. Talk with your health care provider to find out if you are at risk forhepatitis B infection. Hepatitis C Testing is recommended for: Everyone born from 1945 through 1965. Anyone with known risk factors for hepatitis C. Sexually transmitted infections (STIs) Get screened for STIs, including gonorrhea and chlamydia, if: You are sexually active and are younger than 47 years of age. You are older than 47 years of age and your health care provider tells you that you are at risk for this type of infection. Your sexual activity has changed since you were last screened, and you are at increased risk for chlamydia or gonorrhea. Ask your health care provider if you are at risk. Ask your health care provider about whether you are at high risk for HIV. Your health care provider may recommend a prescription medicine to help   prevent HIV infection. If you choose to take medicine to prevent HIV, you should first get tested for HIV. You should then be tested every 3 months for as long as you are taking the medicine. Pregnancy If you are about to stop having your period (premenopausal) and you may become pregnant, seek counseling before you get  pregnant. Take 400 to 800 micrograms (mcg) of folic acid every day if you become pregnant. Ask for birth control (contraception) if you want to prevent pregnancy. Osteoporosis and menopause Osteoporosis is a disease in which the bones lose minerals and strength with aging. This can result in bone fractures. If you are 65 years old or older, or if you are at risk for osteoporosis and fractures, ask your health care provider if you should: Be screened for bone loss. Take a calcium or vitamin D supplement to lower your risk of fractures. Be given hormone replacement therapy (HRT) to treat symptoms of menopause. Follow these instructions at home: Lifestyle Do not use any products that contain nicotine or tobacco, such as cigarettes, e-cigarettes, and chewing tobacco. If you need help quitting, ask your health care provider. Do not use street drugs. Do not share needles. Ask your health care provider for help if you need support or information about quitting drugs. Alcohol use Do not drink alcohol if: Your health care provider tells you not to drink. You are pregnant, may be pregnant, or are planning to become pregnant. If you drink alcohol: Limit how much you use to 0-1 drink a day. Limit intake if you are breastfeeding. Be aware of how much alcohol is in your drink. In the [place] one drink equals one 12 oz bottle of beer (355 mL), one 5 oz glass of wine (148 mL), or one 1 oz glass of hard liquor (44 mL). General instructions Schedule regular health, dental, and eye exams. Stay current with your vaccines. Tell your health care provider if: You often feel depressed. You have ever been abused or do not feel safe at home. Summary Adopting a healthy lifestyle and getting preventive care are important in promoting health and wellness. Follow your health care provider's instructions about healthy diet, exercising, and getting tested or screened for diseases. Follow your health care provider's  instructions on monitoring your cholesterol and blood pressure. This information is not intended to replace advice given to you by your health care provider. Make sure you discuss any questions you have with your healthcare provider. Document Revised: 09/24/2018 Document Reviewed: 09/24/2018 Elsevier Patient Education  2022 [employer]  

## 2021-06-07 NOTE — Progress Notes (Signed)
I,Tianna Badgett,acting as a Education administrator for Maximino Greenland, MD.,have documented all relevant documentation on the behalf of Maximino Greenland, MD,as directed by  Maximino Greenland, MD while in the presence of Maximino Greenland, MD.   This visit occurred during the SARS-CoV-2 public health emergency.  Safety protocols were in place, including screening questions prior to the visit, additional usage of staff PPE, and extensive cleaning of exam room while observing appropriate contact time as indicated for disinfecting solutions.  Subjective:     Patient ID: Kristen Wise , female    DOB: [birth year] , 45 y.o.   MRN: [id number]   Chief Complaint  Patient presents with   Annual Exam   Hypertension   Diabetes    HPI  She is here today for a full physical examination. She is followed by GYN, Dr. Garwin Brothers for her pelvic examinations. She was last seen Feb 2022. She has no specific concerns or complaints at this time.   Diabetes She presents for her follow-up diabetic visit. She has type 2 diabetes mellitus. There are no hypoglycemic associated symptoms. Pertinent negatives for diabetes include no blurred vision and no chest pain. There are no hypoglycemic complications. Risk factors for coronary artery disease include diabetes mellitus, dyslipidemia, obesity, hypertension and sedentary lifestyle. She is following a diabetic diet. She participates in exercise intermittently. Her breakfast blood glucose is taken between 9-10 am. Her breakfast blood glucose range is generally 110-130 mg/dl.  Hypertension This is a chronic problem. The current episode started more than 1 year ago. The problem has been gradually improving since onset. Pertinent negatives include no blurred vision, chest pain, palpitations or shortness of breath.    Past Medical History:  Diagnosis Date   Diabetes mellitus without complication ([place])    Fibroid 08/01/2011   utrerine   Gonorrhea    H/O psoriasis    History of  chicken pox    History of irregular menstrual bleeding    Hypertension      Family History  Problem Relation Age of Onset   Diabetes Maternal Grandmother    Breast cancer Maternal Aunt    Breast cancer Cousin 55   Hypertension Mother    Diabetes Father    Hyperlipidemia Father    Seizures Neg Hx      Current Outpatient Medications:    acetaminophen (TYLENOL) 325 MG tablet, Take by mouth., Disp: , Rfl:    amLODipine (NORVASC) 10 MG tablet, TAKE 1 TABLET(10 MG) BY MOUTH DAILY, Disp: 90 tablet, Rfl: 1   cetirizine (ZYRTEC ALLERGY) 10 MG tablet, Take 1 tablet (10 mg total) by mouth daily., Disp: 30 tablet, Rfl: 2   clobetasol ointment (TEMOVATE) 0.05 %, clobetasol 0.05 % topical ointment  APPLY TO SCALP 3 TIMES PER WEEK AS NEEDED. NOT TO FACE., Disp: , Rfl:    empagliflozin (JARDIANCE) 10 MG TABS tablet, Take 1 tablet (10 mg total) by mouth daily., Disp: 90 tablet, Rfl: 1   ibuprofen (ADVIL,MOTRIN) 600 MG tablet, Take 600 mg by mouth every 6 (six) hours as needed., Disp: , Rfl:    meloxicam (MOBIC) 15 MG tablet, TAKE 1 TABLET(15 MG) BY MOUTH DAILY, Disp: 30 tablet, Rfl: 0   metFORMIN (GLUCOPHAGE) 500 MG tablet, Take 1 tablet (500 mg total) by mouth 2 (two) times daily with a meal., Disp: 180 tablet, Rfl: 1   olmesartan (BENICAR) 40 MG tablet, TAKE 1 TABLET(40 MG) BY MOUTH DAILY, Disp: 90 tablet, Rfl: 1   omeprazole (PRILOSEC) 40 MG capsule,  One capsule po qd prn, Disp: 90 capsule, Rfl: 1   Semaglutide, 1 MG/DOSE, (OZEMPIC, 1 MG/DOSE,) 4 MG/3ML SOPN, Inject 1 mg into the skin once a week., Disp: 3 mL, Rfl: 5   spironolactone (ALDACTONE) 50 MG tablet, TAKE 1 TABLET(50 MG) BY MOUTH DAILY, Disp: 90 tablet, Rfl: 1   Vitamin D, Ergocalciferol, (DRISDOL) 1.25 MG (50000 UNIT) CAPS capsule, One capsule po twice weekly on Tuesdays/Fridays, Disp: 24 capsule, Rfl: 1   Allergies  Allergen Reactions   Lisinopril Other (See Comments)      The patient states she uses status post hysterectomy for  birth control. Last LMP was Patient's last menstrual period was 07/24/2018.. Negative for Dysmenorrhea. Negative for: breast discharge, breast lump(s), breast pain and breast self exam. Associated symptoms include abnormal vaginal bleeding. Pertinent negatives include abnormal bleeding (hematology), anxiety, decreased libido, depression, difficulty falling sleep, dyspareunia, history of infertility, nocturia, sexual dysfunction, sleep disturbances, urinary incontinence, urinary urgency, vaginal discharge and vaginal itching. Diet regular.The patient states her exercise level is  intermittent.   . The patient's tobacco use is:  Social History   Tobacco Use  Smoking Status Never  Smokeless Tobacco Never  . She has been exposed to passive smoke. The patient's alcohol use is:  Social History   Substance and Sexual Activity  Alcohol Use No   Review of Systems  Constitutional: Negative.   HENT: Negative.    Eyes: Negative.  Negative for blurred vision.  Respiratory: Negative.  Negative for shortness of breath.   Cardiovascular: Negative.  Negative for chest pain and palpitations.  Gastrointestinal: Negative.   Endocrine: Negative.   Genitourinary: Negative.   Musculoskeletal: Negative.   Skin: Negative.   Allergic/Immunologic: Negative.   Neurological: Negative.   Hematological: Negative.   Psychiatric/Behavioral: Negative.      Today's Vitals   06/07/21 0928  BP: 118/70  Pulse: 82  Temp: 97.8 F (36.6 C)  TempSrc: Oral  Weight: 237 lb 6.4 oz (107.7 kg)  Height: $Remove'5\' 6"'FauotkZ$  (1.676 m)   Body mass index is 38.32 kg/m.  Wt Readings from Last 3 Encounters:  06/07/21 237 lb 6.4 oz (107.7 kg)  12/15/20 231 lb 12.8 oz (105.1 kg)  05/31/20 229 lb 6.4 oz (104.1 kg)    Objective:  Physical Exam Vitals and nursing note reviewed.  Constitutional:      Appearance: Normal appearance.  HENT:     Head: Normocephalic and atraumatic.     Right Ear: Tympanic membrane, ear canal and external  ear normal.     Left Ear: Tympanic membrane, ear canal and external ear normal.     Nose: Nose normal.     Mouth/Throat:     Mouth: Mucous membranes are moist.     Pharynx: Oropharynx is clear.  Eyes:     Extraocular Movements: Extraocular movements intact.     Conjunctiva/sclera: Conjunctivae normal.     Pupils: Pupils are equal, round, and reactive to light.  Cardiovascular:     Rate and Rhythm: Normal rate and regular rhythm.     Pulses: Normal pulses.          Dorsalis pedis pulses are 2+ on the right side and 2+ on the left side.     Heart sounds: Normal heart sounds.  Pulmonary:     Effort: Pulmonary effort is normal.     Breath sounds: Normal breath sounds.  Abdominal:     General: Abdomen is flat. Bowel sounds are normal.     Palpations: Abdomen is  soft.  Genitourinary:    Comments: deferred Musculoskeletal:        General: Normal range of motion.     Cervical back: Normal range of motion and neck supple.  Feet:     Right foot:     Protective Sensation: 5 sites tested.  5 sites sensed.     Skin integrity: Skin integrity normal.     Toenail Condition: Right toenails are normal.     Left foot:     Protective Sensation: 5 sites tested.  5 sites sensed.     Skin integrity: Skin integrity normal.     Toenail Condition: Left toenails are normal.  Skin:    General: Skin is warm and dry.     Comments: Keloids on anterior chest/abdomen  Neurological:     General: No focal deficit present.     Mental Status: She is alert and oriented to person, place, and time.  Psychiatric:        Mood and Affect: Mood normal.        Behavior: Behavior normal.        Assessment And Plan:     1. Abnormal physical evaluation Comments: A full exam was performed. IMportance of monthly self breast exams was discussed with the patient. She has yet to have colonoscopy, will place GI referral. PATIENT IS ADVISED TO GET 30-45 MINUTES REGULAR EXERCISE NO LESS THAN FOUR TO FIVE DAYS PER WEEK -  BOTH WEIGHTBEARING EXERCISES AND AEROBIC ARE RECOMMENDED.  PATIENT IS ADVISED TO FOLLOW A HEALTHY DIET WITH AT LEAST SIX FRUITS/VEGGIES PER DAY, DECREASE INTAKE OF RED MEAT, AND TO INCREASE FISH INTAKE TO TWO DAYS PER WEEK.  MEATS/FISH SHOULD NOT BE FRIED, BAKED OR BROILED IS PREFERABLE.  IT IS ALSO IMPORTANT TO CUT BACK ON YOUR SUGAR INTAKE. PLEASE AVOID ANYTHING WITH ADDED SUGAR, CORN SYRUP OR OTHER SWEETENERS. IF YOU MUST USE A SWEETENER, YOU CAN TRY STEVIA. IT IS ALSO IMPORTANT TO AVOID ARTIFICIALLY SWEETENERS AND DIET BEVERAGES. LASTLY, I SUGGEST WEARING SPF 50 SUNSCREEN ON EXPOSED PARTS AND ESPECIALLY WHEN IN THE DIRECT SUNLIGHT FOR AN EXTENDED PERIOD OF TIME.  PLEASE AVOID FAST FOOD RESTAURANTS AND INCREASE YOUR WATER INTAKE.  - CBC - Hemoglobin A1c - CMP14+EGFR - Lipid panel  2. Essential hypertension, benign Comments: Chronic, well controlled. EKG performed, NSR w/o acute changes. Encouraged to follow low sodium diet. She will f/u in 6 months.  - POCT Urinalysis Dipstick (81002) - POCT UA - Microalbumin - EKG 12-Lead  3. Type 2 diabetes mellitus with hyperglycemia, without long-term current use of insulin (HCC) Comments: Diabetic foot exam was performed. She will f/u in 4 months. I DISCUSSED WITH THE PATIENT AT LENGTH REGARDING THE GOALS OF GLYCEMIC CONTROL AND POSSIBLE LONG-TERM COMPLICATIONS.  I  ALSO STRESSED THE IMPORTANCE OF COMPLIANCE WITH HOME GLUCOSE MONITORING, DIETARY RESTRICTIONS INCLUDING AVOIDANCE OF SUGARY DRINKS/PROCESSED FOODS,  ALONG WITH REGULAR EXERCISE.  I  ALSO STRESSED THE IMPORTANCE OF ANNUAL EYE EXAMS, SELF FOOT CARE AND COMPLIANCE WITH OFFICE VISITS.  4. Vitamin D deficiency Comments: I will check vitamin D level and supplement as needed.  - VITAMIN D 25 Hydroxy (Vit-D Deficiency, Fractures)  5. Class 2 severe obesity due to excess calories with serious comorbidity and body mass index (BMI) of 38.0 to 38.9 in adult [facility]) Comments: She is encouraged to strive for  BMI less than 30 to decrease cardiac risk. Advised to aim for at least 150 minutes of exercise per week.  6. Screen for colon cancer  Comments: I will refer her to [facility] as requested.  - Ambulatory referral to Gastroenterology  Patient was given opportunity to ask questions. Patient verbalized understanding of the plan and was able to repeat key elements of the plan. All questions were answered to their satisfaction.   I, Maximino Greenland, MD, have reviewed all documentation for this visit. The documentation on 06/07/21 for the exam, diagnosis, procedures, and orders are all accurate and complete.  THE PATIENT IS ENCOURAGED TO PRACTICE SOCIAL DISTANCING DUE TO THE COVID-19 PANDEMIC.

## 2021-06-13 ENCOUNTER — Encounter: Payer: Self-pay | Admitting: Internal Medicine

## 2021-06-26 ENCOUNTER — Other Ambulatory Visit: Payer: Self-pay | Admitting: Internal Medicine

## 2021-06-27 ENCOUNTER — Encounter: Payer: Self-pay | Admitting: Internal Medicine

## 2021-07-05 ENCOUNTER — Other Ambulatory Visit: Payer: Self-pay

## 2021-07-05 LAB — CBC
Hematocrit: 44.3 % (ref 34.0–46.6)
Hemoglobin: 14.8 g/dL (ref 11.1–15.9)
MCH: 28.4 pg (ref 26.6–33.0)
MCHC: 33.4 g/dL (ref 31.5–35.7)
MCV: 85 fL (ref 79–97)
Platelets: 228 10*3/uL (ref 150–450)
RBC: 5.22 x10E6/uL (ref 3.77–5.28)
RDW: 13.2 % (ref 11.7–15.4)
WBC: 6.6 10*3/uL (ref 3.4–10.8)

## 2021-07-05 LAB — CMP14+EGFR
ALT: 24 IU/L (ref 0–32)
AST: 17 IU/L (ref 0–40)
Albumin/Globulin Ratio: 1.4 (ref 1.2–2.2)
Albumin: 4.6 g/dL (ref 3.8–4.8)
Alkaline Phosphatase: 99 IU/L (ref 44–121)
BUN/Creatinine Ratio: 15 (ref 9–23)
BUN: 11 mg/dL (ref 6–24)
Bilirubin Total: 0.4 mg/dL (ref 0.0–1.2)
CO2: 22 mmol/L (ref 20–29)
Calcium: 9.7 mg/dL (ref 8.7–10.2)
Chloride: 98 mmol/L (ref 96–106)
Creatinine, Ser: 0.73 mg/dL (ref 0.57–1.00)
Globulin, Total: 3.2 g/dL (ref 1.5–4.5)
Glucose: 122 mg/dL — ABNORMAL HIGH (ref 65–99)
Potassium: 4.1 mmol/L (ref 3.5–5.2)
Sodium: 138 mmol/L (ref 134–144)
Total Protein: 7.8 g/dL (ref 6.0–8.5)
eGFR: 102 mL/min/{1.73_m2} (ref >59)

## 2021-07-05 LAB — VITAMIN D 25 HYDROXY (VIT D DEFICIENCY, FRACTURES): Vit D, 25-Hydroxy: 19.9 ng/mL — ABNORMAL LOW (ref 30.0–100.0)

## 2021-07-05 LAB — LIPID PANEL
Chol/HDL Ratio: 3.3 ratio (ref 0.0–4.4)
Cholesterol, Total: 221 mg/dL — ABNORMAL HIGH (ref 100–199)
HDL: 68 mg/dL (ref >39)
LDL Chol Calc (NIH): 131 mg/dL — ABNORMAL HIGH (ref 0–99)
Triglycerides: 123 mg/dL (ref 0–149)
VLDL Cholesterol Cal: 22 mg/dL (ref 5–40)

## 2021-07-05 LAB — HEMOGLOBIN A1C
Est. average glucose Bld gHb Est-mCnc: 151 mg/dL
Hgb A1c MFr Bld: 6.9 % — ABNORMAL HIGH (ref 4.8–5.6)

## 2021-07-05 MED ORDER — VITAMIN D (ERGOCALCIFEROL) 1.25 MG (50000 UNIT) PO CAPS: ORAL_CAPSULE | ORAL | 0 refills | Status: DC

## 2021-08-07 ENCOUNTER — Other Ambulatory Visit: Payer: Self-pay

## 2021-08-07 ENCOUNTER — Encounter: Payer: Self-pay | Admitting: Nurse Practitioner

## 2021-08-07 DIAGNOSIS — E1165 Type 2 diabetes mellitus with hyperglycemia: Secondary | ICD-10-CM

## 2021-08-07 MED ORDER — EMPAGLIFLOZIN 10 MG PO TABS: 10.0000 mg | ORAL_TABLET | Freq: Every day | ORAL | 1 refills | Status: DC

## 2021-08-16 ENCOUNTER — Ambulatory Visit: Payer: No Typology Code available for payment source | Admitting: Internal Medicine

## 2021-08-23 ENCOUNTER — Other Ambulatory Visit: Payer: Self-pay | Admitting: Internal Medicine

## 2021-08-23 DIAGNOSIS — Z1231 Encounter for screening mammogram for malignant neoplasm of breast: Secondary | ICD-10-CM

## 2021-08-30 ENCOUNTER — Encounter: Payer: Self-pay | Admitting: Internal Medicine

## 2021-08-30 ENCOUNTER — Ambulatory Visit (INDEPENDENT_AMBULATORY_CARE_PROVIDER_SITE_OTHER): Payer: No Typology Code available for payment source | Admitting: Internal Medicine

## 2021-08-30 ENCOUNTER — Other Ambulatory Visit: Payer: Self-pay

## 2021-08-30 VITALS — BP 114/82 | HR 84 | Temp 98.9°F | Wt 231.6 lb

## 2021-08-30 DIAGNOSIS — K5909 Other constipation: Secondary | ICD-10-CM

## 2021-08-30 DIAGNOSIS — E7800 Pure hypercholesterolemia, unspecified: Secondary | ICD-10-CM | POA: Diagnosis not present

## 2021-08-30 DIAGNOSIS — E1165 Type 2 diabetes mellitus with hyperglycemia: Secondary | ICD-10-CM | POA: Diagnosis not present

## 2021-08-30 DIAGNOSIS — I10 Essential (primary) hypertension: Secondary | ICD-10-CM

## 2021-08-30 DIAGNOSIS — Z23 Encounter for immunization: Secondary | ICD-10-CM

## 2021-08-30 NOTE — Patient Instructions (Signed)

## 2021-08-30 NOTE — Progress Notes (Signed)
Rich Brave Llittleton,acting as a Education administrator for Maximino Greenland, MD.,have documented all relevant documentation on the behalf of Maximino Greenland, MD,as directed by  Maximino Greenland, MD while in the presence of Maximino Greenland, MD.  This visit occurred during the SARS-CoV-2 public health emergency.  Safety protocols were in place, including screening questions prior to the visit, additional usage of staff PPE, and extensive cleaning of exam room while observing appropriate contact time as indicated for disinfecting solutions.  Subjective:     Patient ID: Kristen Wise , female    DOB: [birth year] , 45 y.o.   MRN: [id number]   Chief Complaint  Patient presents with   Diabetes   Hypertension    HPI  Patient presents today for a DM/HTN f/u. Patient state she stopped taking the Ozempic because It was causing her some constipation.  Diabetes She presents for her follow-up diabetic visit. She has type 2 diabetes mellitus. There are no hypoglycemic associated symptoms. Pertinent negatives for diabetes include no blurred vision and no chest pain. There are no hypoglycemic complications. Risk factors for coronary artery disease include diabetes mellitus, dyslipidemia, obesity, hypertension and sedentary lifestyle. She is following a diabetic diet. She participates in exercise intermittently. Her breakfast blood glucose is taken between 9-10 am. Her breakfast blood glucose range is generally 110-130 mg/dl.  Hypertension This is a chronic problem. The current episode started more than 1 year ago. The problem has been gradually improving since onset. Pertinent negatives include no blurred vision, chest pain, palpitations or shortness of breath.    Past Medical History:  Diagnosis Date   Diabetes mellitus without complication ([place])    Fibroid 08/01/2011   utrerine   Gonorrhea    H/O psoriasis    History of chicken pox    History of irregular menstrual bleeding    Hypertension      Family  History  Problem Relation Age of Onset   Diabetes Maternal Grandmother    Breast cancer Maternal Aunt    Breast cancer Cousin 29   Hypertension Mother    Diabetes Father    Hyperlipidemia Father    Seizures Neg Hx      Current Outpatient Medications:    acetaminophen (TYLENOL) 325 MG tablet, Take by mouth., Disp: , Rfl:    amLODipine (NORVASC) 10 MG tablet, TAKE 1 TABLET(10 MG) BY MOUTH DAILY, Disp: 90 tablet, Rfl: 1   cetirizine (ZYRTEC ALLERGY) 10 MG tablet, Take 1 tablet (10 mg total) by mouth daily., Disp: 30 tablet, Rfl: 2   clobetasol ointment (TEMOVATE) 0.05 %, clobetasol 0.05 % topical ointment  APPLY TO SCALP 3 TIMES PER WEEK AS NEEDED. NOT TO FACE., Disp: , Rfl:    empagliflozin (JARDIANCE) 10 MG TABS tablet, Take 1 tablet (10 mg total) by mouth daily., Disp: 90 tablet, Rfl: 1   ibuprofen (ADVIL,MOTRIN) 600 MG tablet, Take 600 mg by mouth every 6 (six) hours as needed., Disp: , Rfl:    meloxicam (MOBIC) 15 MG tablet, TAKE 1 TABLET(15 MG) BY MOUTH DAILY, Disp: 30 tablet, Rfl: 0   olmesartan (BENICAR) 40 MG tablet, TAKE 1 TABLET(40 MG) BY MOUTH DAILY, Disp: 90 tablet, Rfl: 1   omeprazole (PRILOSEC) 40 MG capsule, One capsule po qd prn, Disp: 90 capsule, Rfl: 1   spironolactone (ALDACTONE) 50 MG tablet, TAKE 1 TABLET(50 MG) BY MOUTH DAILY, Disp: 90 tablet, Rfl: 1   Vitamin D, Ergocalciferol, (DRISDOL) 1.25 MG (50000 UNIT) CAPS capsule, One capsule po twice weekly on  Tuesdays/Fridays, Disp: 24 capsule, Rfl: 0   Allergies  Allergen Reactions   Lisinopril Other (See Comments)     Review of Systems  Constitutional: Negative.   Eyes:  Negative for blurred vision.  Respiratory: Negative.  Negative for shortness of breath.   Cardiovascular: Negative.  Negative for chest pain and palpitations.  Gastrointestinal:  Positive for constipation.  Neurological: Negative.   Psychiatric/Behavioral: Negative.      Today's Vitals   08/30/21 1425  BP: 114/82  Pulse: 84  Temp: 98.9 F  (37.2 C)  Weight: 231 lb 9.6 oz (105.1 kg)   Body mass index is 37.38 kg/m.   Objective:  Physical Exam Vitals and nursing note reviewed.  Constitutional:      Appearance: Normal appearance. She is obese.  HENT:     Head: Normocephalic and atraumatic.     Nose:     Comments: Masked     Mouth/Throat:     Comments: Masked  Eyes:     Extraocular Movements: Extraocular movements intact.  Cardiovascular:     Rate and Rhythm: Normal rate and regular rhythm.     Heart sounds: Normal heart sounds.  Pulmonary:     Effort: Pulmonary effort is normal.     Breath sounds: Normal breath sounds.  Musculoskeletal:     Cervical back: Normal range of motion.  Skin:    General: Skin is warm.  Neurological:     General: No focal deficit present.     Mental Status: She is alert.  Psychiatric:        Mood and Affect: Mood normal.        Behavior: Behavior normal.        Assessment And Plan:     1. Type 2 diabetes mellitus with hyperglycemia, without long-term current use of insulin (HCC) Comments: I will check BMP, a1c. She was given lab slip b/c she prefers to have labs drawn at [facility] will be drawn in Dec. She will rto in March 2023 for f/u.   2. Essential hypertension, benign Comments: Chronic, well controlled. Encouraged to follow low sodium diet.   3. Pure hypercholesterolemia Comments: Sept 2022 revealed LDL 131. We discussed need for statin therapy to decrease cardiac risk.   4. Chronic constipation Comments: She will c/w Metamucil daily. Advised to try Miralax prn should her sx persist. Reminded she should stay well hydrated.    Patient was given opportunity to ask questions. Patient verbalized understanding of the plan and was able to repeat key elements of the plan. All questions were answered to their satisfaction.   I, Maximino Greenland, MD, have reviewed all documentation for this visit. The documentation on 08/30/21 for the exam, diagnosis, procedures, and orders  are all accurate and complete.   IF YOU HAVE BEEN REFERRED TO A SPECIALIST, IT MAY TAKE 1-2 WEEKS TO SCHEDULE/PROCESS THE REFERRAL. IF YOU HAVE NOT HEARD FROM US/SPECIALIST IN TWO WEEKS, PLEASE GIVE [place] A CALL AT [phone].   THE PATIENT IS ENCOURAGED TO PRACTICE SOCIAL DISTANCING DUE TO THE COVID-19 PANDEMIC.

## 2021-09-02 DIAGNOSIS — E7800 Pure hypercholesterolemia, unspecified: Secondary | ICD-10-CM | POA: Insufficient documentation

## 2021-09-02 DIAGNOSIS — K5909 Other constipation: Secondary | ICD-10-CM | POA: Insufficient documentation

## 2021-09-25 ENCOUNTER — Ambulatory Visit
Admission: RE | Admit: 2021-09-25 | Discharge: 2021-09-25 | Disposition: A | Discharge status: Home / Self Care | Payer: No Typology Code available for payment source | Source: Ambulatory Visit | Attending: Internal Medicine | Admitting: Internal Medicine

## 2021-09-25 DIAGNOSIS — Z1231 Encounter for screening mammogram for malignant neoplasm of breast: Secondary | ICD-10-CM

## 2021-10-06 ENCOUNTER — Other Ambulatory Visit: Payer: Self-pay | Admitting: Internal Medicine

## 2021-10-07 LAB — BASIC METABOLIC PANEL
BUN/Creatinine Ratio: 17 (ref 9–23)
BUN: 16 mg/dL (ref 6–24)
CO2: 20 mmol/L (ref 20–29)
Calcium: 9.5 mg/dL (ref 8.7–10.2)
Chloride: 107 mmol/L — ABNORMAL HIGH (ref 96–106)
Creatinine, Ser: 0.92 mg/dL (ref 0.57–1.00)
Glucose: 151 mg/dL — ABNORMAL HIGH (ref 70–99)
Potassium: 4 mmol/L (ref 3.5–5.2)
Sodium: 141 mmol/L (ref 134–144)
eGFR: 77 mL/min/{1.73_m2} (ref >59)

## 2021-10-07 LAB — B12 AND FOLATE PANEL
Folate: 7 ng/mL (ref >3.0)
Vitamin B-12: 344 pg/mL (ref 232–1245)

## 2021-10-07 LAB — HGB A1C W/O EAG: Hgb A1c MFr Bld: 6.5 % — ABNORMAL HIGH (ref 4.8–5.6)

## 2021-12-28 ENCOUNTER — Other Ambulatory Visit: Payer: Self-pay | Admitting: Internal Medicine

## 2022-01-03 ENCOUNTER — Ambulatory Visit (INDEPENDENT_AMBULATORY_CARE_PROVIDER_SITE_OTHER): Payer: No Typology Code available for payment source | Admitting: Internal Medicine

## 2022-01-03 ENCOUNTER — Other Ambulatory Visit: Payer: Self-pay

## 2022-01-03 ENCOUNTER — Encounter: Payer: Self-pay | Admitting: Internal Medicine

## 2022-01-03 VITALS — BP 130/70 | HR 63 | Temp 98.3°F | Ht 66.0 in | Wt 231.0 lb

## 2022-01-03 DIAGNOSIS — E7800 Pure hypercholesterolemia, unspecified: Secondary | ICD-10-CM

## 2022-01-03 DIAGNOSIS — E559 Vitamin D deficiency, unspecified: Secondary | ICD-10-CM | POA: Diagnosis not present

## 2022-01-03 DIAGNOSIS — Z6837 Body mass index (BMI) 37.0-37.9, adult: Secondary | ICD-10-CM

## 2022-01-03 DIAGNOSIS — E1165 Type 2 diabetes mellitus with hyperglycemia: Secondary | ICD-10-CM | POA: Diagnosis not present

## 2022-01-03 DIAGNOSIS — N951 Menopausal and female climacteric states: Secondary | ICD-10-CM

## 2022-01-03 DIAGNOSIS — Z1211 Encounter for screening for malignant neoplasm of colon: Secondary | ICD-10-CM

## 2022-01-03 DIAGNOSIS — I10 Essential (primary) hypertension: Secondary | ICD-10-CM | POA: Diagnosis not present

## 2022-01-03 MED ORDER — VITAMIN D (ERGOCALCIFEROL) 1.25 MG (50000 UNIT) PO CAPS: ORAL_CAPSULE | ORAL | 0 refills | Status: DC

## 2022-01-03 NOTE — Progress Notes (Signed)
?Industrial/product designer as a Education administrator for Maximino Greenland, MD.,have documented all relevant documentation on the behalf of Maximino Greenland, MD,as directed by  Maximino Greenland, MD while in the presence of Maximino Greenland, MD. ? ?This visit occurred during the SARS-CoV-2 public health emergency.  Safety protocols were in place, including screening questions prior to the visit, additional usage of staff PPE, and extensive cleaning of exam room while observing appropriate contact time as indicated for disinfecting solutions. ? ?Subjective:  ?  ? Patient ID: Kristen Wise , female    DOB: 1976/02/16 , 46 y.o.   MRN: [id number] ? ? ?Chief Complaint  ?Patient presents with  ? Diabetes  ? Hypertension  ? ? ?HPI ? ?Patient presents today for a DM/HTN f/u. She states that she restarted her Ozempic about 2 months ago. She has no specific concerns. Otherwise reports compliance with meds.  ? ?Diabetes ?She presents for her follow-up diabetic visit. She has type 2 diabetes mellitus. There are no hypoglycemic associated symptoms. Pertinent negatives for diabetes include no blurred vision and no chest pain. There are no hypoglycemic complications. Risk factors for coronary artery disease include diabetes mellitus, dyslipidemia, obesity, hypertension and sedentary lifestyle. She is following a diabetic diet. She participates in exercise intermittently. Her breakfast blood glucose is taken between 9-10 am. Her breakfast blood glucose range is generally 110-130 mg/dl.  ?Hypertension ?This is a chronic problem. The current episode started more than 1 year ago. The problem has been gradually improving since onset. Pertinent negatives include no blurred vision, chest pain, palpitations or shortness of breath.   ? ?Past Medical History:  ?Diagnosis Date  ? Diabetes mellitus without complication ([place])   ? Fibroid 08/01/2011  ? utrerine  ? Gonorrhea   ? H/O psoriasis   ? History of chicken pox   ? History of irregular menstrual  bleeding   ? Hypertension   ?  ? ?Family History  ?Problem Relation Age of Onset  ? Diabetes Maternal Grandmother   ? Breast cancer Maternal Aunt   ? Breast cancer Cousin 13  ? Hypertension Mother   ? Diabetes Father   ? Hyperlipidemia Father   ? Seizures Neg Hx   ? ? ? ?Current Outpatient Medications:  ?  acetaminophen (TYLENOL) 325 MG tablet, Take by mouth., Disp: , Rfl:  ?  amLODipine (NORVASC) 10 MG tablet, TAKE 1 TABLET(10 MG) BY MOUTH DAILY, Disp: 90 tablet, Rfl: 1 ?  cetirizine (ZYRTEC ALLERGY) 10 MG tablet, Take 1 tablet (10 mg total) by mouth daily., Disp: 30 tablet, Rfl: 2 ?  clobetasol ointment (TEMOVATE) 0.05 %, clobetasol 0.05 % topical ointment  APPLY TO SCALP 3 TIMES PER WEEK AS NEEDED. NOT TO FACE., Disp: , Rfl:  ?  ibuprofen (ADVIL,MOTRIN) 600 MG tablet, Take 600 mg by mouth every 6 (six) hours as needed., Disp: , Rfl:  ?  meloxicam (MOBIC) 15 MG tablet, TAKE 1 TABLET(15 MG) BY MOUTH DAILY, Disp: 30 tablet, Rfl: 0 ?  olmesartan (BENICAR) 40 MG tablet, TAKE 1 TABLET(40 MG) BY MOUTH DAILY, Disp: 90 tablet, Rfl: 1 ?  omeprazole (PRILOSEC) 40 MG capsule, One capsule po qd prn, Disp: 90 capsule, Rfl: 1 ?  Semaglutide, 1 MG/DOSE, (OZEMPIC, 1 MG/DOSE,) 4 MG/3ML SOPN, Inject into the skin., Disp: , Rfl:  ?  spironolactone (ALDACTONE) 50 MG tablet, TAKE 1 TABLET(50 MG) BY MOUTH DAILY, Disp: 90 tablet, Rfl: 1 ?  Vitamin D, Ergocalciferol, (DRISDOL) 1.25 MG (50000 UNIT) CAPS capsule, One capsule  po twice weekly on Tuesdays/Fridays, Disp: 24 capsule, Rfl: 0  ? ?Allergies  ?Allergen Reactions  ? Lisinopril Other (See Comments)  ?  ? ?Review of Systems  ?Constitutional: Negative.   ?Eyes:  Negative for blurred vision.  ?Respiratory: Negative.  Negative for shortness of breath.   ?Cardiovascular: Negative.  Negative for chest pain and palpitations.  ?Gastrointestinal: Negative.   ?Neurological: Negative.    ? ?Today's Vitals  ? 01/03/22 1517  ?BP: 130/70  ?Pulse: 63  ?Temp: 98.3 ?F (36.8 ?C)  ?TempSrc: Oral   ?Weight: 231 lb (104.8 kg)  ?Height: _0  (1.676 m)  ? ?Body mass index is 37.28 kg/m?.  ?Wt Readings from Last 3 Encounters:  ?01/03/22 231 lb (104.8 kg)  ?08/30/21 231 lb 9.6 oz (105.1 kg)  ?06/07/21 237 lb 6.4 oz (107.7 kg)  ? ? ?Objective:  ?Physical Exam ?Vitals and nursing note reviewed.  ?Constitutional:   ?   Appearance: Normal appearance. She is obese.  ?HENT:  ?   Head: Normocephalic and atraumatic.  ?   Nose:  ?   Comments: Masked  ?   Mouth/Throat:  ?   Comments: Masked  ?Eyes:  ?   Extraocular Movements: Extraocular movements intact.  ?Cardiovascular:  ?   Rate and Rhythm: Normal rate and regular rhythm.  ?   Heart sounds: Normal heart sounds.  ?Pulmonary:  ?   Effort: Pulmonary effort is normal.  ?   Breath sounds: Normal breath sounds.  ?Musculoskeletal:  ?   Cervical back: Normal range of motion.  ?Skin: ?   General: Skin is warm.  ?Neurological:  ?   General: No focal deficit present.  ?   Mental Status: She is alert.  ?Psychiatric:     ?   Mood and Affect: Mood normal.     ?   Behavior: Behavior normal.  ?  ? ?   ?Assessment And Plan:  ?   ?1. Type 2 diabetes mellitus with hyperglycemia, without long-term current use of insulin ([place]) ?Comments: Chronic,  Importance of dietary, exercise, medication compliance was d/w patient in detail. She prefers to have her labs drawn closer to home, encouraged to get within the next two weeks. I will make further recommendations once her labs are available for review.  ?- Hemoglobin A1c ?- CMP14+EGFR ?- Lipid panel ? ?2. Essential hypertension, benign ?Comments: Chronic, controlled. No med changes today. She is encouraged to follow a low sodium diet.  ? ?3. Pure hypercholesterolemia ?Comments: She is not on statin therapy. I will check lipid panel, I will calculate cardiac risk at that time. LDL goal < 70. ? ?4. Vitamin D deficiency ?Comments: I will check a vitamin D level and supplement as needed.  ? ?5. Perimenopause ?Comments: She is encouraged to consider  golden milk to drink nightly, along with dandelion root tea. Sx should improve with incorporation of clean eating.  ? ?6. Class 2 severe obesity due to excess calories with serious comorbidity and body mass index (BMI) of 37.0 to 37.9 in adult [facility]) ?Comments: She is encouraged to strive for BMI<30 to decrease cardiac risk. Advised to aim for at least 150 minutes of exercise per week.  ? ?7. Special screening for malignant neoplasm of colon ?Comments: She agrees to GI referral for CRC screening.  ?- Ambulatory referral to Gastroenterology ?  ?Patient was given opportunity to ask questions. Patient verbalized understanding of the plan and was able to repeat key elements of the plan. All questions were answered to  their satisfaction.  ? ?I, Maximino Greenland, MD, have reviewed all documentation for this visit. The documentation on 01/03/22 for the exam, diagnosis, procedures, and orders are all accurate and complete.  ? ?IF YOU HAVE BEEN REFERRED TO A SPECIALIST, IT MAY TAKE 1-2 WEEKS TO SCHEDULE/PROCESS THE REFERRAL. IF YOU HAVE NOT HEARD FROM US/SPECIALIST IN TWO WEEKS, PLEASE GIVE [place] A CALL AT [phone].  ? ?THE PATIENT IS ENCOURAGED TO PRACTICE SOCIAL DISTANCING DUE TO THE COVID-19 PANDEMIC.   ?

## 2022-01-03 NOTE — Patient Instructions (Addendum)
Dandelion root tea ?Ginger/turmeric tea ? ?Diabetes Mellitus and Exercise ?Exercising regularly is important for overall health, especially for people who have diabetes mellitus. Exercising is not only about losing weight. It has many other health benefits, such as increasing muscle strength and bone density and reducing body fat and stress. This leads to improved fitness, flexibility, and endurance, all of which result in better overall health. ?What are the benefits of exercise if I have diabetes? ?Exercise has many benefits for people with diabetes. They include: ?Helping to lower and control blood sugar (glucose). ?Helping the body to respond better to the hormone insulin by improving insulin sensitivity. ?Reducing how much insulin the body needs. ?Lowering the risk for heart disease by: ?Lowering "bad" cholesterol and triglyceride levels. ?Increasing "good" cholesterol levels. ?Lowering blood pressure. ?Lowering blood glucose levels. ?What is my activity plan? ?Your health care provider or certified diabetes educator can help you make a plan for the type and frequency of exercise that works for you. This is called your activity plan. Be sure to: ?Get at least 150 minutes of medium-intensity or high-intensity exercise each week. Exercises may include brisk walking, biking, or water aerobics. ?Do stretching and strengthening exercises, such as yoga or weight lifting, at least 2 times a week. ?Spread out your activity over at least 3 days of the week. ?Get some form of physical activity each day. ?Do not go more than 2 days in a row without some kind of physical activity. ?Avoid being inactive for more than 90 minutes at a time. Take frequent breaks to walk or stretch. ?Choose exercises or activities that you enjoy. Set realistic goals. ?Start slowly and gradually increase your exercise intensity over time. ?How do I manage my diabetes during exercise? ?Monitor your blood glucose ?Check your blood glucose before  and after exercising. If your blood glucose is: ?240 mg/dL (13.3 mmol/L) or higher before you exercise, check your urine for ketones. These are chemicals created by the liver. If you have ketones in your urine, do not exercise until your blood glucose returns to normal. ?100 mg/dL (5.6 mmol/L) or lower, eat a snack containing 15-20 grams of carbohydrate. Check your blood glucose 15 minutes after the snack to make sure that your glucose level is above 100 mg/dL (5.6 mmol/L) before you start your exercise. ?Know the symptoms of low blood glucose (hypoglycemia) and how to treat it. Your risk for hypoglycemia increases during and after exercise. ?Follow these tips and your health care provider's instructions ?Keep a carbohydrate snack that is fast-acting for use before, during, and after exercise to help prevent or treat hypoglycemia. ?Avoid injecting insulin into areas of the body that are going to be exercised. For example, avoid injecting insulin into: ?Your arms, when you are about to play tennis. ?Your legs, when you are about to go jogging. ?Keep records of your exercise habits. Doing this can help you and your health care provider adjust your diabetes management plan as needed. Write down: ?Food that you eat before and after you exercise. ?Blood glucose levels before and after you exercise. ?The type and amount of exercise you have done. ?Work with your health care provider when you start a new exercise or activity. He or she may need to: ?Make sure that the activity is safe for you. ?Adjust your insulin, other medicines, and food that you eat. ?Drink plenty of water while you exercise. This prevents loss of water (dehydration) and problems caused by a lot of heat in the body (heat  stroke). ?Where to find more information ?American Diabetes Association: www.diabetes.org ?Summary ?Exercising regularly is important for overall health, especially for people who have diabetes mellitus. ?Exercising has many health  benefits. It increases muscle strength and bone density and reduces body fat and stress. It also lowers and controls blood glucose. ?Your health care provider or certified diabetes educator can help you make an activity plan for the type and frequency of exercise that works for you. ?Work with your health care provider to make sure any new activity is safe for you. Also work with your health care provider to adjust your insulin, other medicines, and the food you eat. ?This information is not intended to replace advice given to you by your health care provider. Make sure you discuss any questions you have with your health care provider. ?Document Revised: 06/29/2019 Document Reviewed: 06/29/2019 ?Elsevier Patient Education ? [place]. ? ?

## 2022-01-04 ENCOUNTER — Other Ambulatory Visit: Payer: Self-pay | Admitting: Nurse Practitioner

## 2022-01-06 ENCOUNTER — Other Ambulatory Visit: Payer: Self-pay

## 2022-01-06 ENCOUNTER — Encounter: Payer: Self-pay | Admitting: Internal Medicine

## 2022-01-06 ENCOUNTER — Other Ambulatory Visit: Payer: Self-pay | Admitting: Internal Medicine

## 2022-01-06 LAB — CMP14+EGFR
ALT: 32 IU/L (ref 0–32)
AST: 23 IU/L (ref 0–40)
Albumin/Globulin Ratio: 1.4 (ref 1.2–2.2)
Albumin: 4.6 g/dL (ref 3.8–4.8)
Alkaline Phosphatase: 97 IU/L (ref 44–121)
BUN/Creatinine Ratio: 13 (ref 9–23)
BUN: 11 mg/dL (ref 6–24)
Bilirubin Total: 0.3 mg/dL (ref 0.0–1.2)
CO2: 21 mmol/L (ref 20–29)
Calcium: 9.8 mg/dL (ref 8.7–10.2)
Chloride: 105 mmol/L (ref 96–106)
Creatinine, Ser: 0.82 mg/dL (ref 0.57–1.00)
Globulin, Total: 3.2 g/dL (ref 1.5–4.5)
Glucose: 100 mg/dL — ABNORMAL HIGH (ref 70–99)
Potassium: 5 mmol/L (ref 3.5–5.2)
Sodium: 144 mmol/L (ref 134–144)
Total Protein: 7.8 g/dL (ref 6.0–8.5)
eGFR: 88 mL/min/{1.73_m2} (ref >59)

## 2022-01-06 LAB — LIPID PANEL
Chol/HDL Ratio: 2.8 ratio (ref 0.0–4.4)
Cholesterol, Total: 188 mg/dL (ref 100–199)
HDL: 68 mg/dL (ref >39)
LDL Chol Calc (NIH): 107 mg/dL — ABNORMAL HIGH (ref 0–99)
Triglycerides: 69 mg/dL (ref 0–149)
VLDL Cholesterol Cal: 13 mg/dL (ref 5–40)

## 2022-01-06 LAB — HEMOGLOBIN A1C
Est. average glucose Bld gHb Est-mCnc: 128 mg/dL
Hgb A1c MFr Bld: 6.1 % — ABNORMAL HIGH (ref 4.8–5.6)

## 2022-01-06 MED ORDER — OZEMPIC (1 MG/DOSE) 4 MG/3ML ~~LOC~~ SOPN: 1.0000 mg | PEN_INJECTOR | SUBCUTANEOUS | 3 refills | Status: DC

## 2022-02-08 ENCOUNTER — Encounter: Payer: Self-pay | Admitting: Internal Medicine

## 2022-02-13 ENCOUNTER — Other Ambulatory Visit: Payer: Self-pay | Admitting: Internal Medicine

## 2022-02-13 DIAGNOSIS — E1165 Type 2 diabetes mellitus with hyperglycemia: Secondary | ICD-10-CM

## 2022-02-13 DIAGNOSIS — I10 Essential (primary) hypertension: Secondary | ICD-10-CM

## 2022-02-13 DIAGNOSIS — E7800 Pure hypercholesterolemia, unspecified: Secondary | ICD-10-CM

## 2022-02-20 ENCOUNTER — Encounter: Payer: Self-pay | Admitting: Internal Medicine

## 2022-03-02 LAB — HM COLONOSCOPY

## 2022-04-16 ENCOUNTER — Ambulatory Visit: Payer: No Typology Code available for payment source | Admitting: Internal Medicine

## 2022-05-01 ENCOUNTER — Other Ambulatory Visit: Payer: Self-pay | Admitting: Internal Medicine

## 2022-06-20 ENCOUNTER — Encounter: Payer: Self-pay | Admitting: Internal Medicine

## 2022-06-20 ENCOUNTER — Ambulatory Visit (INDEPENDENT_AMBULATORY_CARE_PROVIDER_SITE_OTHER): Payer: No Typology Code available for payment source | Admitting: Internal Medicine

## 2022-06-20 VITALS — BP 130/64 | HR 87 | Temp 98.0°F | Ht 66.2 in | Wt 223.4 lb

## 2022-06-20 DIAGNOSIS — I10 Essential (primary) hypertension: Secondary | ICD-10-CM

## 2022-06-20 DIAGNOSIS — E7800 Pure hypercholesterolemia, unspecified: Secondary | ICD-10-CM

## 2022-06-20 DIAGNOSIS — Z0000 Encounter for general adult medical examination without abnormal findings: Secondary | ICD-10-CM

## 2022-06-20 DIAGNOSIS — E6609 Other obesity due to excess calories: Secondary | ICD-10-CM

## 2022-06-20 DIAGNOSIS — Z2821 Immunization not carried out because of patient refusal: Secondary | ICD-10-CM

## 2022-06-20 DIAGNOSIS — Z6835 Body mass index (BMI) 35.0-35.9, adult: Secondary | ICD-10-CM

## 2022-06-20 DIAGNOSIS — E1165 Type 2 diabetes mellitus with hyperglycemia: Secondary | ICD-10-CM

## 2022-06-20 LAB — POCT URINALYSIS DIPSTICK
Bilirubin, UA: NEGATIVE
Blood, UA: NEGATIVE
Glucose, UA: POSITIVE — AB
Ketones, UA: NEGATIVE
Leukocytes, UA: NEGATIVE
Nitrite, UA: NEGATIVE
Protein, UA: NEGATIVE
Spec Grav, UA: 1.025 (ref 1.010–1.025)
Urobilinogen, UA: 0.2 E.U./dL
pH, UA: 5.5 (ref 5.0–8.0)

## 2022-06-20 MED ORDER — AMLODIPINE BESYLATE 10 MG PO TABS: ORAL_TABLET | ORAL | 1 refills | Status: DC

## 2022-06-20 MED ORDER — OLMESARTAN MEDOXOMIL 40 MG PO TABS: ORAL_TABLET | ORAL | 1 refills | Status: DC

## 2022-06-20 NOTE — Patient Instructions (Addendum)
Please increase Ozempic to 1.'5mg'$  weekly  Inject '1mg'$ , remove and replace needle  Then dial up 36 clicks and do another injection  This equals 1.'5mg'$    The 10-year ASCVD risk score (Arnett DK, et al., 2019) is: 1.9%   Values used to calculate the score:     Age: 46 years     Sex: Female     Is Non-Hispanic African American: No     Diabetic: Yes     Tobacco smoker: No     Systolic Blood Pressure: 741 mmHg     Is BP treated: Yes     HDL Cholesterol: 68 mg/dL     Total Cholesterol: 188 mg/dL   Health Maintenance, Female Adopting a healthy lifestyle and getting preventive care are important in promoting health and wellness. Ask your health care provider about: The right schedule for you to have regular tests and exams. Things you can do on your own to prevent diseases and keep yourself healthy. What should I know about diet, weight, and exercise? Eat a healthy diet  Eat a diet that includes plenty of vegetables, fruits, low-fat dairy products, and lean protein. Do not eat a lot of foods that are high in solid fats, added sugars, or sodium. Maintain a healthy weight Body mass index (BMI) is used to identify weight problems. It estimates body fat based on height and weight. Your health care provider can help determine your BMI and help you achieve or maintain a healthy weight. Get regular exercise Get regular exercise. This is one of the most important things you can do for your health. Most adults should: Exercise for at least 150 minutes each week. The exercise should increase your heart rate and make you sweat (moderate-intensity exercise). Do strengthening exercises at least twice a week. This is in addition to the moderate-intensity exercise. Spend less time sitting. Even light physical activity can be beneficial. Watch cholesterol and blood lipids Have your blood tested for lipids and cholesterol at 48 years of age, then have this test every 5 years. Have your cholesterol levels  checked more often if: Your lipid or cholesterol levels are high. You are older than 48 years of age. You are at high risk for heart disease. What should I know about cancer screening? Depending on your health history and family history, you may need to have cancer screening at various ages. This may include screening for: Breast cancer. Cervical cancer. Colorectal cancer. Skin cancer. Lung cancer. What should I know about heart disease, diabetes, and high blood pressure? Blood pressure and heart disease High blood pressure causes heart disease and increases the risk of stroke. This is more likely to develop in people who have high blood pressure readings or are overweight. Have your blood pressure checked: Every 3-5 years if you are 9-24 years of age. Every year if you are 30 years old or older. Diabetes Have regular diabetes screenings. This checks your fasting blood sugar level. Have the screening done: Once every three years after age 59 if you are at a normal weight and have a low risk for diabetes. More often and at a younger age if you are overweight or have a high risk for diabetes. What should I know about preventing infection? Hepatitis B If you have a higher risk for hepatitis B, you should be screened for this virus. Talk with your health care provider to find out if you are at risk for hepatitis B infection. Hepatitis C Testing is recommended for: Everyone born  from [place] through 1967. Anyone with known risk factors for hepatitis C. Sexually transmitted infections (STIs) Get screened for STIs, including gonorrhea and chlamydia, if: You are sexually active and are younger than 48 years of age. You are older than 48 years of age and your health care provider tells you that you are at risk for this type of infection. Your sexual activity has changed since you were last screened, and you are at increased risk for chlamydia or gonorrhea. Ask your health care provider if you  are at risk. Ask your health care provider about whether you are at high risk for HIV. Your health care provider may recommend a prescription medicine to help prevent HIV infection. If you choose to take medicine to prevent HIV, you should first get tested for HIV. You should then be tested every 3 months for as long as you are taking the medicine. Pregnancy If you are about to stop having your period (premenopausal) and you may become pregnant, seek counseling before you get pregnant. Take 400 to 800 micrograms (mcg) of folic acid every day if you become pregnant. Ask for birth control (contraception) if you want to prevent pregnancy. Osteoporosis and menopause Osteoporosis is a disease in which the bones lose minerals and strength with aging. This can result in bone fractures. If you are 38 years old or older, or if you are at risk for osteoporosis and fractures, ask your health care provider if you should: Be screened for bone loss. Take a calcium or vitamin D supplement to lower your risk of fractures. Be given hormone replacement therapy (HRT) to treat symptoms of menopause. Follow these instructions at home: Alcohol use Do not drink alcohol if: Your health care provider tells you not to drink. You are pregnant, may be pregnant, or are planning to become pregnant. If you drink alcohol: Limit how much you have to: 0-1 drink a day. Know how much alcohol is in your drink. In the [place] one drink equals one 12 oz bottle of beer (355 mL), one 5 oz glass of wine (148 mL), or one 1 oz glass of hard liquor (44 mL). Lifestyle Do not use any products that contain nicotine or tobacco. These products include cigarettes, chewing tobacco, and vaping devices, such as e-cigarettes. If you need help quitting, ask your health care provider. Do not use street drugs. Do not share needles. Ask your health care provider for help if you need support or information about quitting drugs. General  instructions Schedule regular health, dental, and eye exams. Stay current with your vaccines. Tell your health care provider if: You often feel depressed. You have ever been abused or do not feel safe at home. Summary Adopting a healthy lifestyle and getting preventive care are important in promoting health and wellness. Follow your health care provider's instructions about healthy diet, exercising, and getting tested or screened for diseases. Follow your health care provider's instructions on monitoring your cholesterol and blood pressure. This information is not intended to replace advice given to you by your health care provider. Make sure you discuss any questions you have with your health care provider. Document Revised: 02/20/2021 Document Reviewed: 02/20/2021 Elsevier Patient Education  [place].

## 2022-06-20 NOTE — Progress Notes (Signed)
Rich Brave Llittleton,acting as a Education administrator for Maximino Greenland, MD.,have documented all relevant documentation on the behalf of Maximino Greenland, MD,as directed by  Maximino Greenland, MD while in the presence of Maximino Greenland, MD.   Subjective:     Patient ID: Kristen Wise , female    DOB: [birth year] , 46 y.o.   MRN: [id number]   Chief Complaint  Patient presents with   Annual Exam   Diabetes   Hypertension   Knee Pain    HPI  She is here today for a full physical examination. She is followed by GYN for her pelvic examinations. She has no specific concerns or complaints at this time.   Diabetes She presents for her follow-up diabetic visit. She has type 2 diabetes mellitus. Her disease course has been stable. There are no hypoglycemic associated symptoms. Pertinent negatives for diabetes include no blurred vision. There are no hypoglycemic complications. Risk factors for coronary artery disease include diabetes mellitus, dyslipidemia, obesity, hypertension and sedentary lifestyle. She is following a diabetic diet. She participates in exercise intermittently. Her breakfast blood glucose is taken between 9-10 am. Her breakfast blood glucose range is generally 110-130 mg/dl.  Hypertension This is a chronic problem. The current episode started more than 1 year ago. The problem has been gradually improving since onset. Pertinent negatives include no blurred vision.     Past Medical History:  Diagnosis Date   Diabetes mellitus without complication ([place])    Fibroid 08/01/2011   utrerine   Gonorrhea    H/O psoriasis    History of chicken pox    History of irregular menstrual bleeding    Hypertension      Family History  Problem Relation Age of Onset   Diabetes Maternal Grandmother    Breast cancer Maternal Aunt    Breast cancer Cousin 42   Hypertension Mother    Diabetes Father    Hyperlipidemia Father    Seizures Neg Hx      Current Outpatient Medications:     acetaminophen (TYLENOL) 325 MG tablet, Take by mouth., Disp: , Rfl:    cetirizine (ZYRTEC ALLERGY) 10 MG tablet, Take 1 tablet (10 mg total) by mouth daily., Disp: 30 tablet, Rfl: 2   clobetasol ointment (TEMOVATE) 0.05 %, clobetasol 0.05 % topical ointment  APPLY TO SCALP 3 TIMES PER WEEK AS NEEDED. NOT TO FACE., Disp: , Rfl:    ibuprofen (ADVIL,MOTRIN) 600 MG tablet, Take 600 mg by mouth every 6 (six) hours as needed., Disp: , Rfl:    meloxicam (MOBIC) 15 MG tablet, TAKE 1 TABLET(15 MG) BY MOUTH DAILY, Disp: 30 tablet, Rfl: 0   omeprazole (PRILOSEC) 40 MG capsule, One capsule po qd prn, Disp: 90 capsule, Rfl: 1   OZEMPIC, 1 MG/DOSE, 4 MG/3ML SOPN, INJECT 1 MG UNDER THE SKIN ONCE A WEEK, Disp: 3 mL, Rfl: 3   Vitamin D, Ergocalciferol, (DRISDOL) 1.25 MG (50000 UNIT) CAPS capsule, One capsule po twice weekly on Tuesdays/Fridays, Disp: 24 capsule, Rfl: 0   amLODipine (NORVASC) 10 MG tablet, TAKE 1 TABLET(10 MG) BY MOUTH DAILY, Disp: 90 tablet, Rfl: 1   atorvastatin (LIPITOR) 20 MG tablet, Take 1 tablet (20 mg total) by mouth daily., Disp: 30 tablet, Rfl: 11   metFORMIN (GLUCOPHAGE) 500 MG tablet, TAKE 1 TABLET(500 MG) BY MOUTH TWICE DAILY WITH A MEAL, Disp: 180 tablet, Rfl: 1   olmesartan (BENICAR) 40 MG tablet, TAKE 1 TABLET(40 MG) BY MOUTH DAILY, Disp: 90 tablet, Rfl: 1  spironolactone (ALDACTONE) 50 MG tablet, TAKE 1 TABLET(50 MG) BY MOUTH DAILY, Disp: 90 tablet, Rfl: 1   Allergies  Allergen Reactions   Lisinopril Other (See Comments)      The patient states she uses status post hysterectomy for birth control. Last LMP was Patient's last menstrual period was 07/24/2018.. Negative for Dysmenorrhea. Negative for: breast discharge, breast lump(s), breast pain and breast self exam. Associated symptoms include abnormal vaginal bleeding. Pertinent negatives include abnormal bleeding (hematology), anxiety, decreased libido, depression, difficulty falling sleep, dyspareunia, history of infertility,  nocturia, sexual dysfunction, sleep disturbances, urinary incontinence, urinary urgency, vaginal discharge and vaginal itching. Diet regular.The patient states her exercise level is  moderate.  . The patient's tobacco use is:  Social History   Tobacco Use  Smoking Status Never  Smokeless Tobacco Never  . She has been exposed to passive smoke. The patient's alcohol use is:  Social History   Substance and Sexual Activity  Alcohol Use No    Review of Systems  Constitutional: Negative.   HENT: Negative.    Eyes: Negative.  Negative for blurred vision.  Respiratory: Negative.    Cardiovascular: Negative.   Gastrointestinal: Negative.   Endocrine: Negative.   Genitourinary: Negative.   Musculoskeletal: Negative.   Skin: Negative.   Allergic/Immunologic: Negative.   Neurological: Negative.   Hematological: Negative.   Psychiatric/Behavioral: Negative.       Today's Vitals   06/20/22 0836  BP: 130/64  Pulse: 87  Temp: 98 F (36.7 C)  Weight: 223 lb 6.4 oz (101.3 kg)  Height: 5' 6.2" (1.681 m)  PainSc: 0-No pain   Body mass index is 35.84 kg/m.  Wt Readings from Last 3 Encounters:  06/20/22 223 lb 6.4 oz (101.3 kg)  01/03/22 231 lb (104.8 kg)  08/30/21 231 lb 9.6 oz (105.1 kg)     Objective:  Physical Exam Vitals and nursing note reviewed.  Constitutional:      Appearance: Normal appearance. She is obese.  HENT:     Head: Normocephalic and atraumatic.     Right Ear: Tympanic membrane, ear canal and external ear normal.     Left Ear: Tympanic membrane, ear canal and external ear normal.     Nose:     Comments: Masked     Mouth/Throat:     Comments: Masked  Eyes:     Extraocular Movements: Extraocular movements intact.     Conjunctiva/sclera: Conjunctivae normal.     Pupils: Pupils are equal, round, and reactive to light.  Cardiovascular:     Rate and Rhythm: Normal rate and regular rhythm.     Pulses: Normal pulses.     Heart sounds: Normal heart sounds.   Pulmonary:     Effort: Pulmonary effort is normal.     Breath sounds: Normal breath sounds.  Chest:  Breasts:    Tanner Score is 5.     Right: Normal.     Left: Normal.  Abdominal:     General: Abdomen is flat. Bowel sounds are normal.     Palpations: Abdomen is soft.  Genitourinary:    Comments: deferred Musculoskeletal:        General: Normal range of motion.     Cervical back: Normal range of motion and neck supple.  Skin:    General: Skin is warm and dry.     Comments: [place]   Neurological:     General: No focal deficit present.     Mental Status: She is alert and oriented to person,  place, and time.  Psychiatric:        Mood and Affect: Mood normal.        Behavior: Behavior normal.      Assessment And Plan:     1. Encounter for general adult medical examination w/o abnormal findings Comments: A full exam was performed. Importance of monthly self breast exams was discussed with the patient. PATIENT IS ADVISED TO GET 30-45 MINUTES REGULAR EXERCISE NO LESS THAN FOUR TO FIVE DAYS PER WEEK - BOTH WEIGHTBEARING EXERCISES AND AEROBIC ARE RECOMMENDED.  PATIENT IS ADVISED TO FOLLOW A HEALTHY DIET WITH AT LEAST SIX FRUITS/VEGGIES PER DAY, DECREASE INTAKE OF RED MEAT, AND TO INCREASE FISH INTAKE TO TWO DAYS PER WEEK.  MEATS/FISH SHOULD NOT BE FRIED, BAKED OR BROILED IS PREFERABLE.  IT IS ALSO IMPORTANT TO CUT BACK ON YOUR SUGAR INTAKE. PLEASE AVOID ANYTHING WITH ADDED SUGAR, CORN SYRUP OR OTHER SWEETENERS. IF YOU MUST USE A SWEETENER, YOU CAN TRY STEVIA. IT IS ALSO IMPORTANT TO AVOID ARTIFICIALLY SWEETENERS AND DIET BEVERAGES. LASTLY, I SUGGEST WEARING SPF 50 SUNSCREEN ON EXPOSED PARTS AND ESPECIALLY WHEN IN THE DIRECT SUNLIGHT FOR AN EXTENDED PERIOD OF TIME.  PLEASE AVOID FAST FOOD RESTAURANTS AND INCREASE YOUR WATER INTAKE. - CBC - CMP14+EGFR - Lipid panel - Hemoglobin A1c  2. Type 2 diabetes mellitus with hyperglycemia, without long-term current use of insulin ([facility]) Comments:  Diabetic foot exam was performed. Goal a1c <7.0%, ideally<6.5%. She will c/w weekly Ozempic, f/u in 3-4 months. I DISCUSSED WITH THE PATIENT AT LENGTH REGARDING THE GOALS OF GLYCEMIC CONTROL AND POSSIBLE LONG-TERM COMPLICATIONS.  I  ALSO STRESSED THE IMPORTANCE OF COMPLIANCE WITH HOME GLUCOSE MONITORING, DIETARY RESTRICTIONS INCLUDING AVOIDANCE OF SUGARY DRINKS/PROCESSED FOODS,  ALONG WITH REGULAR EXERCISE.  I  ALSO STRESSED THE IMPORTANCE OF ANNUAL EYE EXAMS, SELF FOOT CARE AND COMPLIANCE WITH OFFICE VISITS.  - POCT Urinalysis Dipstick (81002) - Urine microalbumin-creatinine with uACR  3. Essential hypertension, benign Comments: Chronic, controlled.  EKG performed, NSR w/o acute changes. She will c/w amlodipine 10mg , olmesartan 40mg  and spironolactone 50mg  daily.  - EKG 12-Lead - amLODipine (NORVASC) 10 MG tablet; TAKE 1 TABLET(10 MG) BY MOUTH DAILY  Dispense: 90 tablet; Refill: 1 - olmesartan (BENICAR) 40 MG tablet; TAKE 1 TABLET(40 MG) BY MOUTH DAILY  Dispense: 90 tablet; Refill: 1  4. Pure hypercholesterolemia Comments: Chronic, we discussed statin use. Goal LDL <70.   Also discussed use of cardiac calcium scoring to assess cardiac risk.   5. Class 2 severe obesity due to excess calories with serious comorbidity and body mass index (BMI) of 35.0 to 35.9 in adult [facility]) Comments: She was congratulated on her 8lb weight loss since March 2023. She is encouraged to aim for at least 150 minutes of exercise per week.   6. Influenza vaccination declined  Patient was given opportunity to ask questions. Patient verbalized understanding of the plan and was able to repeat key elements of the plan. All questions were answered to their satisfaction.   I, Maximino Greenland, MD, have reviewed all documentation for this visit. The documentation on 06/20/22 for the exam, diagnosis, procedures, and orders are all accurate and complete.   THE PATIENT IS ENCOURAGED TO PRACTICE SOCIAL DISTANCING DUE TO THE  COVID-19 PANDEMIC.

## 2022-06-21 LAB — MICROALBUMIN / CREATININE URINE RATIO
Creatinine, Urine: 187 mg/dL
Microalb/Creat Ratio: 7 mg/g creat (ref 0–29)
Microalbumin, Urine: 13.3 ug/mL

## 2022-06-21 LAB — LIPID PANEL
Chol/HDL Ratio: 2.5 ratio (ref 0.0–4.4)
Cholesterol, Total: 208 mg/dL — ABNORMAL HIGH (ref 100–199)
HDL: 84 mg/dL (ref >39)
LDL Chol Calc (NIH): 112 mg/dL — ABNORMAL HIGH (ref 0–99)
Triglycerides: 68 mg/dL (ref 0–149)
VLDL Cholesterol Cal: 12 mg/dL (ref 5–40)

## 2022-06-21 LAB — CBC
Hematocrit: 46.7 % — ABNORMAL HIGH (ref 34.0–46.6)
Hemoglobin: 15.1 g/dL (ref 11.1–15.9)
MCH: 28.1 pg (ref 26.6–33.0)
MCHC: 32.3 g/dL (ref 31.5–35.7)
MCV: 87 fL (ref 79–97)
Platelets: 266 10*3/uL (ref 150–450)
RBC: 5.38 x10E6/uL — ABNORMAL HIGH (ref 3.77–5.28)
RDW: 13.1 % (ref 11.7–15.4)
WBC: 7.5 10*3/uL (ref 3.4–10.8)

## 2022-06-21 LAB — CMP14+EGFR
ALT: 25 IU/L (ref 0–32)
AST: 17 IU/L (ref 0–40)
Albumin/Globulin Ratio: 1.4 (ref 1.2–2.2)
Albumin: 4.6 g/dL (ref 3.9–4.9)
Alkaline Phosphatase: 110 IU/L (ref 44–121)
BUN/Creatinine Ratio: 16 (ref 9–23)
BUN: 14 mg/dL (ref 6–24)
Bilirubin Total: 0.3 mg/dL (ref 0.0–1.2)
CO2: 22 mmol/L (ref 20–29)
Calcium: 9.8 mg/dL (ref 8.7–10.2)
Chloride: 100 mmol/L (ref 96–106)
Creatinine, Ser: 0.87 mg/dL (ref 0.57–1.00)
Globulin, Total: 3.3 g/dL (ref 1.5–4.5)
Glucose: 87 mg/dL (ref 70–99)
Potassium: 4.6 mmol/L (ref 3.5–5.2)
Sodium: 138 mmol/L (ref 134–144)
Total Protein: 7.9 g/dL (ref 6.0–8.5)
eGFR: 82 mL/min/{1.73_m2} (ref >59)

## 2022-06-21 LAB — HEMOGLOBIN A1C
Est. average glucose Bld gHb Est-mCnc: 128 mg/dL
Hgb A1c MFr Bld: 6.1 % — ABNORMAL HIGH (ref 4.8–5.6)

## 2022-06-28 ENCOUNTER — Other Ambulatory Visit: Payer: Self-pay | Admitting: Internal Medicine

## 2022-06-28 ENCOUNTER — Ambulatory Visit
Admission: RE | Admit: 2022-06-28 | Discharge: 2022-06-28 | Disposition: A | Discharge status: Home / Self Care | Payer: No Typology Code available for payment source | Source: Ambulatory Visit | Attending: Internal Medicine | Admitting: Internal Medicine

## 2022-06-28 DIAGNOSIS — E1165 Type 2 diabetes mellitus with hyperglycemia: Secondary | ICD-10-CM | POA: Insufficient documentation

## 2022-06-28 DIAGNOSIS — E7800 Pure hypercholesterolemia, unspecified: Secondary | ICD-10-CM | POA: Insufficient documentation

## 2022-06-28 DIAGNOSIS — I10 Essential (primary) hypertension: Secondary | ICD-10-CM | POA: Insufficient documentation

## 2022-06-29 NOTE — Telephone Encounter (Signed)
Approve spirnolactone

## 2022-07-03 ENCOUNTER — Encounter: Payer: Self-pay | Admitting: Internal Medicine

## 2022-07-05 ENCOUNTER — Other Ambulatory Visit: Payer: Self-pay

## 2022-07-05 ENCOUNTER — Other Ambulatory Visit: Payer: Self-pay | Admitting: Internal Medicine

## 2022-07-05 MED ORDER — ATORVASTATIN CALCIUM 20 MG PO TABS: 20.0000 mg | ORAL_TABLET | Freq: Every day | ORAL | 11 refills | Status: DC

## 2022-07-05 MED ORDER — SPIRONOLACTONE 50 MG PO TABS: ORAL_TABLET | ORAL | 1 refills | Status: DC

## 2022-07-10 ENCOUNTER — Other Ambulatory Visit: Payer: Self-pay | Admitting: Internal Medicine

## 2022-08-06 ENCOUNTER — Other Ambulatory Visit: Payer: Self-pay | Admitting: Internal Medicine

## 2022-08-06 DIAGNOSIS — Z1231 Encounter for screening mammogram for malignant neoplasm of breast: Secondary | ICD-10-CM

## 2022-08-11 LAB — HM DIABETES EYE EXAM

## 2022-08-27 ENCOUNTER — Other Ambulatory Visit: Payer: No Typology Code available for payment source

## 2022-08-27 DIAGNOSIS — E7800 Pure hypercholesterolemia, unspecified: Secondary | ICD-10-CM

## 2022-08-27 DIAGNOSIS — R899 Unspecified abnormal finding in specimens from other organs, systems and tissues: Secondary | ICD-10-CM

## 2022-08-27 LAB — LIPID PANEL
Chol/HDL Ratio: 2 ratio (ref 0.0–4.4)
Cholesterol, Total: 140 mg/dL (ref 100–199)
HDL: 71 mg/dL (ref >39)
LDL Chol Calc (NIH): 54 mg/dL (ref 0–99)
Triglycerides: 78 mg/dL (ref 0–149)
VLDL Cholesterol Cal: 15 mg/dL (ref 5–40)

## 2022-08-27 LAB — ALT: ALT: 16 IU/L (ref 0–32)

## 2022-10-01 ENCOUNTER — Ambulatory Visit: Payer: No Typology Code available for payment source

## 2022-10-02 ENCOUNTER — Other Ambulatory Visit: Payer: Self-pay | Admitting: Internal Medicine

## 2022-10-23 ENCOUNTER — Encounter: Payer: Self-pay | Admitting: Internal Medicine

## 2022-10-23 ENCOUNTER — Ambulatory Visit (INDEPENDENT_AMBULATORY_CARE_PROVIDER_SITE_OTHER): Payer: No Typology Code available for payment source | Admitting: Internal Medicine

## 2022-10-23 VITALS — BP 124/80 | HR 71 | Temp 98.2°F | Ht 66.4 in | Wt 228.8 lb

## 2022-10-23 DIAGNOSIS — I10 Essential (primary) hypertension: Secondary | ICD-10-CM | POA: Diagnosis not present

## 2022-10-23 DIAGNOSIS — E1169 Type 2 diabetes mellitus with other specified complication: Secondary | ICD-10-CM

## 2022-10-23 DIAGNOSIS — Z6836 Body mass index (BMI) 36.0-36.9, adult: Secondary | ICD-10-CM

## 2022-10-23 DIAGNOSIS — E785 Hyperlipidemia, unspecified: Secondary | ICD-10-CM

## 2022-10-23 DIAGNOSIS — E559 Vitamin D deficiency, unspecified: Secondary | ICD-10-CM

## 2022-10-23 MED ORDER — SPIRONOLACTONE 50 MG PO TABS: ORAL_TABLET | ORAL | 1 refills | Status: DC

## 2022-10-23 MED ORDER — OLMESARTAN MEDOXOMIL 40 MG PO TABS: ORAL_TABLET | ORAL | 1 refills | Status: DC

## 2022-10-23 MED ORDER — METFORMIN HCL 500 MG PO TABS: ORAL_TABLET | ORAL | 1 refills | Status: DC

## 2022-10-23 MED ORDER — AMLODIPINE BESYLATE 10 MG PO TABS: ORAL_TABLET | ORAL | 1 refills | Status: DC

## 2022-10-23 NOTE — Progress Notes (Signed)
Kristen Wise,acting as a Education administrator for Maximino Greenland, MD.,have documented all relevant documentation on the behalf of Maximino Greenland, MD,as directed by  Maximino Greenland, MD while in the presence of Maximino Greenland, MD.    Subjective:     Patient ID: Kristen Wise , female    DOB: [birth year] , 47 y.o.   MRN: [id number]   Chief Complaint  Patient presents with   Diabetes   Hypertension    HPI  Patient presents today for a DM/HTN f/u. Patient reports compliance with her meds. She denies having any headaches, cp and sob. She wants to discuss her chol meds.   Diabetes She presents for her follow-up diabetic visit. She has type 2 diabetes mellitus. There are no hypoglycemic associated symptoms. Pertinent negatives for diabetes include no blurred vision and no chest pain. There are no hypoglycemic complications. Risk factors for coronary artery disease include diabetes mellitus, dyslipidemia, obesity, hypertension and sedentary lifestyle. She is following a diabetic diet. She participates in exercise intermittently. Her breakfast blood glucose is taken between 9-10 am. Her breakfast blood glucose range is generally 110-130 mg/dl.  Hypertension This is a chronic problem. The current episode started more than 1 year ago. The problem has been gradually improving since onset. Pertinent negatives include no blurred vision, chest pain, palpitations or shortness of breath.     Past Medical History:  Diagnosis Date   Diabetes mellitus without complication ([place])    Fibroid 08/01/2011   utrerine   Gonorrhea    H/O psoriasis    History of chicken pox    History of irregular menstrual bleeding    Hypertension      Family History  Problem Relation Age of Onset   Diabetes Maternal Grandmother    Breast cancer Maternal Aunt    Breast cancer Cousin 41   Hypertension Mother    Diabetes Father    Hyperlipidemia Father    Seizures Neg Hx      Current Outpatient Medications:     acetaminophen (TYLENOL) 325 MG tablet, Take by mouth., Disp: , Rfl:    atorvastatin (LIPITOR) 20 MG tablet, Take 1 tablet (20 mg total) by mouth daily., Disp: 30 tablet, Rfl: 11   cetirizine (ZYRTEC ALLERGY) 10 MG tablet, Take 1 tablet (10 mg total) by mouth daily., Disp: 30 tablet, Rfl: 2   clobetasol ointment (TEMOVATE) 0.05 %, clobetasol 0.05 % topical ointment  APPLY TO SCALP 3 TIMES PER WEEK AS NEEDED. NOT TO FACE., Disp: , Rfl:    omeprazole (PRILOSEC) 40 MG capsule, One capsule po qd prn, Disp: 90 capsule, Rfl: 1   OZEMPIC, 1 MG/DOSE, 4 MG/3ML SOPN, INJECT 1 MG UNDER THE SKIN ONE DAY A WEEK, Disp: 3 mL, Rfl: 3   amLODipine (NORVASC) 10 MG tablet, TAKE 1 TABLET(10 MG) BY MOUTH DAILY, Disp: 90 tablet, Rfl: 1   metFORMIN (GLUCOPHAGE) 500 MG tablet, TAKE 1 TABLET(500 MG) BY MOUTH TWICE DAILY WITH A MEAL, Disp: 180 tablet, Rfl: 1   olmesartan (BENICAR) 40 MG tablet, TAKE 1 TABLET(40 MG) BY MOUTH DAILY, Disp: 90 tablet, Rfl: 1   spironolactone (ALDACTONE) 50 MG tablet, TAKE 1 TABLET(50 MG) BY MOUTH DAILY, Disp: 90 tablet, Rfl: 1   Allergies  Allergen Reactions   Lisinopril Other (See Comments)     Review of Systems  Constitutional: Negative.   Eyes:  Negative for blurred vision.  Respiratory: Negative.  Negative for shortness of breath.   Cardiovascular: Negative.  Negative for chest  pain and palpitations.  Gastrointestinal: Negative.   Genitourinary:        She has complaints she thinks are due to hormonal changes. She c/o changes in her body scent, joint pains.   Neurological: Negative.   Psychiatric/Behavioral: Negative.       Today's Vitals   10/23/22 0838  BP: 124/80  Pulse: 71  Temp: 98.2 F (36.8 C)  Weight: 228 lb 12.8 oz (103.8 kg)  Height: 5' 6.4" (1.687 m)  PainSc: 0-No pain   Body mass index is 36.49 kg/m.  left   Objective:  Physical Exam Vitals and nursing note reviewed.  Constitutional:      Appearance: Normal appearance.  HENT:     Head:  Normocephalic and atraumatic.     Nose:     Comments: Masked     Mouth/Throat:     Comments: Masked  Eyes:     Extraocular Movements: Extraocular movements intact.  Cardiovascular:     Rate and Rhythm: Normal rate and regular rhythm.     Heart sounds: Normal heart sounds.  Pulmonary:     Effort: Pulmonary effort is normal.     Breath sounds: Normal breath sounds.  Musculoskeletal:     Cervical back: Normal range of motion.  Skin:    General: Skin is warm.  Neurological:     General: No focal deficit present.     Mental Status: She is alert.  Psychiatric:        Mood and Affect: Mood normal.        Behavior: Behavior normal.         Assessment And Plan:     1. Dyslipidemia due to type 2 diabetes mellitus ([place]) Comments: I will check labs as below. I will adjust meds as needed.  I will request her most recent eye exam. LDL goal <70, she will c/w statin therapy. She will rto in 4 months for re-evaluation. - Hemoglobin A1c - BMP8+eGFR  2. Essential hypertension, benign Comments: Chronic, controlled. She will c/w olmesartan '40mg'$  daily, amlodipine '10mg'$  and spironolactone '25mg'$  daily.  She is encouraged to follow low sodium diet.  - TSH - olmesartan (BENICAR) 40 MG tablet; TAKE 1 TABLET(40 MG) BY MOUTH DAILY  Dispense: 90 tablet; Refill: 1 - spironolactone (ALDACTONE) 50 MG tablet; TAKE 1 TABLET(50 MG) BY MOUTH DAILY  Dispense: 90 tablet; Refill: 1 - amLODipine (NORVASC) 10 MG tablet; TAKE 1 TABLET(10 MG) BY MOUTH DAILY  Dispense: 90 tablet; Refill: 1  3. Vitamin D deficiency Comments: I will check a vitamin D level and supplement as needed. - Vitamin D (25 hydroxy)  4. Class 2 severe obesity due to excess calories with serious comorbidity and body mass index (BMI) of 36.0 to 36.9 in adult [facility]) Comments: She is encouraged to aim for at least 150 minutes of exercise/week, while striving for BMI<30 to decrease cardiac risk.   Patient was given opportunity to ask questions.  Patient verbalized understanding of the plan and was able to repeat key elements of the plan. All questions were answered to their satisfaction.   I, Maximino Greenland, MD, have reviewed all documentation for this visit. The documentation on 10/23/22 for the exam, diagnosis, procedures, and orders are all accurate and complete.   IF YOU HAVE BEEN REFERRED TO A SPECIALIST, IT MAY TAKE 1-2 WEEKS TO SCHEDULE/PROCESS THE REFERRAL. IF YOU HAVE NOT HEARD FROM US/SPECIALIST IN TWO WEEKS, PLEASE GIVE [place] A CALL AT [phone].   THE PATIENT IS ENCOURAGED TO  PRACTICE SOCIAL DISTANCING DUE TO THE COVID-19 PANDEMIC.

## 2022-10-23 NOTE — Patient Instructions (Signed)

## 2022-10-24 LAB — HEMOGLOBIN A1C
Est. average glucose Bld gHb Est-mCnc: 131 mg/dL
Hgb A1c MFr Bld: 6.2 % — ABNORMAL HIGH (ref 4.8–5.6)

## 2022-10-24 LAB — VITAMIN D 25 HYDROXY (VIT D DEFICIENCY, FRACTURES): Vit D, 25-Hydroxy: 32.9 ng/mL (ref 30.0–100.0)

## 2022-10-24 LAB — BMP8+EGFR
BUN/Creatinine Ratio: 21 (ref 9–23)
BUN: 15 mg/dL (ref 6–24)
CO2: 18 mmol/L — ABNORMAL LOW (ref 20–29)
Calcium: 9.4 mg/dL (ref 8.7–10.2)
Chloride: 101 mmol/L (ref 96–106)
Creatinine, Ser: 0.7 mg/dL (ref 0.57–1.00)
Glucose: 84 mg/dL (ref 70–99)
Potassium: 4.4 mmol/L (ref 3.5–5.2)
Sodium: 137 mmol/L (ref 134–144)
eGFR: 107 mL/min/{1.73_m2} (ref >59)

## 2022-10-24 LAB — TSH: TSH: 1.2 u[IU]/mL (ref 0.450–4.500)

## 2022-11-22 ENCOUNTER — Ambulatory Visit
Admission: RE | Admit: 2022-11-22 | Discharge: 2022-11-22 | Disposition: A | Discharge status: Home / Self Care | Payer: No Typology Code available for payment source | Source: Ambulatory Visit | Attending: Internal Medicine | Admitting: Internal Medicine

## 2022-11-22 DIAGNOSIS — Z1231 Encounter for screening mammogram for malignant neoplasm of breast: Secondary | ICD-10-CM

## 2023-02-18 ENCOUNTER — Ambulatory Visit: Payer: No Typology Code available for payment source | Admitting: Internal Medicine

## 2023-02-18 ENCOUNTER — Encounter: Payer: Self-pay | Admitting: Internal Medicine

## 2023-02-18 VITALS — BP 118/70 | HR 70 | Temp 98.3°F | Ht 66.0 in | Wt 232.4 lb

## 2023-02-18 DIAGNOSIS — I10 Essential (primary) hypertension: Secondary | ICD-10-CM

## 2023-02-18 DIAGNOSIS — E785 Hyperlipidemia, unspecified: Secondary | ICD-10-CM | POA: Diagnosis not present

## 2023-02-18 DIAGNOSIS — E1169 Type 2 diabetes mellitus with other specified complication: Secondary | ICD-10-CM

## 2023-02-18 DIAGNOSIS — M1711 Unilateral primary osteoarthritis, right knee: Secondary | ICD-10-CM | POA: Diagnosis not present

## 2023-02-18 DIAGNOSIS — R61 Generalized hyperhidrosis: Secondary | ICD-10-CM

## 2023-02-18 DIAGNOSIS — Z6837 Body mass index (BMI) 37.0-37.9, adult: Secondary | ICD-10-CM

## 2023-02-18 DIAGNOSIS — M2550 Pain in unspecified joint: Secondary | ICD-10-CM

## 2023-02-18 MED ORDER — ATORVASTATIN CALCIUM 20 MG PO TABS
20.0000 mg | ORAL_TABLET | Freq: Every day | ORAL | 2 refills | Status: DC
Start: 2023-02-18 — End: 2023-10-21

## 2023-02-18 MED ORDER — AMLODIPINE BESYLATE 10 MG PO TABS: ORAL_TABLET | ORAL | 1 refills | Status: DC

## 2023-02-18 NOTE — Progress Notes (Signed)
Hershal Coria Martin,acting as a Neurosurgeon for Gwynneth Aliment, MD.,have documented all relevant documentation on the behalf of Gwynneth Aliment, MD,as directed by  Gwynneth Aliment, MD while in the presence of Gwynneth Aliment, MD.    Subjective:     Patient ID: Kristen Wise , female    DOB: 06/24/1976 , 47 y.o.   MRN: [id number]   Chief Complaint  Patient presents with   Diabetes   Hypertension   Hyperlipidemia    HPI  Patient presents today for a DM/HTN f/u. Patient reports compliance with her meds. She denies having any headaches, cp and sob.   BP Readings from Last 3 Encounters: 02/18/23 : 118/70 10/23/22 : 124/80 06/20/22 : 130/64    Diabetes She presents for her follow-up diabetic visit. She has type 2 diabetes mellitus. There are no hypoglycemic associated symptoms. Pertinent negatives for diabetes include no blurred vision and no chest pain. There are no hypoglycemic complications. Risk factors for coronary artery disease include diabetes mellitus, dyslipidemia, obesity, hypertension and sedentary lifestyle. She is following a diabetic diet. She participates in exercise intermittently. Her breakfast blood glucose is taken between 9-10 am. Her breakfast blood glucose range is generally 110-130 mg/dl.  Hypertension This is a chronic problem. The current episode started more than 1 year ago. The problem has been gradually improving since onset. Pertinent negatives include no blurred vision, chest pain, palpitations or shortness of breath.  Hyperlipidemia Pertinent negatives include no chest pain or shortness of breath.     Past Medical History:  Diagnosis Date   Diabetes mellitus without complication ([facility])    Fibroid 08/01/2011   utrerine   Gonorrhea    H/O psoriasis    History of chicken pox    History of irregular menstrual bleeding    Hypertension      Family History  Problem Relation Age of Onset   Diabetes Maternal Grandmother    Breast cancer Maternal  Aunt    Breast cancer Cousin 40   Hypertension Mother    Diabetes Father    Hyperlipidemia Father    Seizures Neg Hx      Current Outpatient Medications:    acetaminophen (TYLENOL) 325 MG tablet, Take by mouth., Disp: , Rfl:    clobetasol ointment (TEMOVATE) 0.05 %, clobetasol 0.05 % topical ointment  APPLY TO SCALP 3 TIMES PER WEEK AS NEEDED. NOT TO FACE., Disp: , Rfl:    Continuous Glucose Sensor (DEXCOM G7 SENSOR) MISC, Use as directed to monitor blood sugars, Disp: 2 each, Rfl: 3   metFORMIN (GLUCOPHAGE) 500 MG tablet, TAKE 1 TABLET(500 MG) BY MOUTH TWICE DAILY WITH A MEAL, Disp: 180 tablet, Rfl: 1   olmesartan (BENICAR) 40 MG tablet, TAKE 1 TABLET(40 MG) BY MOUTH DAILY, Disp: 90 tablet, Rfl: 1   omeprazole (PRILOSEC) 40 MG capsule, One capsule po qd prn, Disp: 90 capsule, Rfl: 1   OZEMPIC, 1 MG/DOSE, 4 MG/3ML SOPN, INJECT 1 MG UNDER THE SKIN ONE DAY A WEEK, Disp: 3 mL, Rfl: 3   spironolactone (ALDACTONE) 50 MG tablet, TAKE 1 TABLET(50 MG) BY MOUTH DAILY, Disp: 90 tablet, Rfl: 1   amLODipine (NORVASC) 10 MG tablet, TAKE 1 TABLET(10 MG) BY MOUTH DAILY, Disp: 90 tablet, Rfl: 1   atorvastatin (LIPITOR) 20 MG tablet, Take 1 tablet (20 mg total) by mouth daily., Disp: 90 tablet, Rfl: 2   cetirizine (ZYRTEC ALLERGY) 10 MG tablet, Take 1 tablet (10 mg total) by mouth daily., Disp: 30 tablet, Rfl: 2  Allergies  Allergen Reactions   Lisinopril Other (See Comments)     Review of Systems  Constitutional: Negative.   Eyes:  Negative for blurred vision.  Respiratory: Negative.  Negative for shortness of breath.   Cardiovascular: Negative.  Negative for chest pain and palpitations.  Musculoskeletal:  Positive for arthralgias.  Skin: Negative.   Neurological: Negative.   Psychiatric/Behavioral: Negative.       Today's Vitals   02/18/23 1207  BP: 118/70  Pulse: 70  Temp: 98.3 F (36.8 C)  TempSrc: Oral  Weight: 232 lb 6.4 oz (105.4 kg)  Height: 5\' 6"  (1.676 m)  PainSc: 0-No pain    Body mass index is 37.51 kg/m.  Wt Readings from Last 3 Encounters:  02/18/23 232 lb 6.4 oz (105.4 kg)  10/23/22 228 lb 12.8 oz (103.8 kg)  06/20/22 223 lb 6.4 oz (101.3 kg)    Objective:  Physical Exam Vitals and nursing note reviewed.  Constitutional:      Appearance: Normal appearance.  HENT:     Head: Normocephalic and atraumatic.     Nose:     Comments: Masked     Mouth/Throat:     Comments: Masked  Eyes:     Extraocular Movements: Extraocular movements intact.  Cardiovascular:     Rate and Rhythm: Normal rate and regular rhythm.     Heart sounds: Normal heart sounds.  Pulmonary:     Effort: Pulmonary effort is normal.     Breath sounds: Normal breath sounds.  Musculoskeletal:     Cervical back: Normal range of motion.  Skin:    General: Skin is warm.  Neurological:     General: No focal deficit present.     Mental Status: She is alert.  Psychiatric:        Mood and Affect: Mood normal.        Behavior: Behavior normal.         Assessment And Plan:     1. Dyslipidemia due to type 2 diabetes mellitus (H[facility]) Comments: Chronic, LDL goal < 70. She will c/w metformin and weekly semaglutide. She will f/u in 4 months. - HM DIABETES EYE EXAM - Hemoglobin A1c - atorvastatin (LIPITOR) 20 MG tablet; Take 1 tablet (20 mg total) by mouth daily.  Dispense: 90 tablet; Refill: 2 - CMP14+EGFR  2. Essential hypertension, benign Comments: Chronic, well controlled. She will c/w olmesartan 40mg , spironolactone and amlodipine 10mg  daily. She is reminded to follow a low sodium diet. - amLODipine (NORVASC) 10 MG tablet; TAKE 1 TABLET(10 MG) BY MOUTH DAILY  Dispense: 90 tablet; Refill: 1  3. Primary osteoarthritis of right knee Comments: She is followed by Dr. Eulah Pont. She has had steroid injection in the past. Advised to follow anti- inflammatory diet, free of processed foods.  4. Arthralgia, unspecified joint Comments: Likely due to low vitamin D and perimenopause.  5.  Night sweats Comments: She is s/p partial hysterectomy. I will check an FSH. - FSH  6. Class 2 severe obesity due to excess calories with serious comorbidity and body mass index (BMI) of 37.0 to 37.9 in adult [facility]) Comments: She is encouraged to aim for at least 150 minutes of exercise/week,while striving for BMI<30 to decrease cardiac risk.   Patient was given opportunity to ask questions. Patient verbalized understanding of the plan and was able to repeat key elements of the plan. All questions were answered to their satisfaction.   I, Gwynneth Aliment, MD, have reviewed all documentation for this visit. The  documentation on 02/18/23 for the exam, diagnosis, procedures, and orders are all accurate and complete.   IF YOU HAVE BEEN REFERRED TO A SPECIALIST, IT MAY TAKE 1-2 WEEKS TO SCHEDULE/PROCESS THE REFERRAL. IF YOU HAVE NOT HEARD FROM US/SPECIALIST IN TWO WEEKS, PLEASE GIVE [place] A CALL AT [phone].   THE PATIENT IS ENCOURAGED TO PRACTICE SOCIAL DISTANCING DUE TO THE COVID-19 PANDEMIC.

## 2023-02-18 NOTE — Patient Instructions (Addendum)
Remifemin, nighttime formula  Type 2 Diabetes Mellitus, Diagnosis, Adult Type 2 diabetes (type 2 diabetes mellitus) is a long-term (chronic) disease. It may happen when there is one or both of these problems: The pancreas does not make enough insulin. The body does not react in a normal way to insulin that it makes. Insulin lets sugars go into cells in your body. If you have type 2 diabetes, sugars cannot get into your cells. Sugars build up in the blood. This causes high blood sugar. What are the causes? The exact cause of this condition is not known. What increases the risk? Having type 2 diabetes in your family. Being overweight or very overweight. Not being active. Your body not reacting in a normal way to the insulin it makes. Having higher than normal blood sugar over time. Having a type of diabetes when you were pregnant. Having a condition that causes small fluid-filled sacs on your ovaries. What are the signs or symptoms? At first, you may have no symptoms. You will get symptoms slowly. They may include: More thirst than normal. More hunger than normal. Needing to pee more than normal. Losing weight without trying. Feeling tired. Feeling weak. Seeing things blurry. Dark patches on your skin. How is this treated? This condition may be treated by a diabetes expert. You may need to: Follow an eating plan made by a food expert (dietitian). Get regular exercise. Find ways to deal with stress. Check blood sugar as often as told. Take medicines. Your doctor will set treatment goals for you. Your blood sugar should be at these levels: Before meals: 80-130 mg/dL (1.6-1.0 mmol/L). After meals: below 180 mg/dL (10 mmol/L). Over the last 2-3 months: less than 7%. Follow these instructions at home: Medicines Take your diabetes medicines or insulin every day. Take medicines as told to help you prevent other problems caused by this condition. You may need: Aspirin. Medicine to  lower cholesterol. Medicine to control blood pressure. Questions to ask your doctor Should I meet with a diabetes educator? What medicines do I need, and when should I take them? What will I need to treat my condition at home? When should I check my blood sugar? Where can I find a support group? Who can I call if I have questions? When is my next doctor visit? General instructions Take over-the-counter and prescription medicines only as told by your doctor. Keep all follow-up visits. Where to find more information For help and guidance and more information about diabetes, please go to: American Diabetes Association (ADA): www.diabetes.org American Association of Diabetes Care and Education Specialists (ADCES): www.diabeteseducator.org International Diabetes Federation (IDF): DCOnly.dk Contact a doctor if: Your blood sugar is at or above 240 mg/dL (96.0 mmol/L) for 2 days in a row. You have been sick for 2 days or more, and you are not getting better. You have had a fever for 2 days or more, and you are not getting better. You have any of these problems for more than 6 hours: You cannot eat or drink. You feel like you may vomit. You vomit. You have watery poop (diarrhea). Get help right away if: Your blood sugar is lower than 54 mg/dL (3 mmol/L). You feel mixed up (confused). You have trouble thinking clearly. You have trouble breathing. You have medium or large ketone levels in your pee. These symptoms may be an emergency. Get help right away. Call your local emergency services (911 in the [place] Do not wait to see if the symptoms will go away.  Do not drive yourself to the hospital. Summary Type 2 diabetes is a long-term disease. Your pancreas may not make enough insulin, or your body may not react in a normal way to insulin that it makes. This condition is treated with an eating plan, lifestyle changes, and medicines. Your doctor will set treatment goals for you. These will  help you keep your blood sugar in a healthy range. Keep all follow-up visits. This information is not intended to replace advice given to you by your health care provider. Make sure you discuss any questions you have with your health care provider. Document Revised: 12/26/2020 Document Reviewed: 12/26/2020 Elsevier Patient Education  2023 [employer].

## 2023-02-19 LAB — CMP14+EGFR
ALT: 28 IU/L (ref 0–32)
AST: 16 IU/L (ref 0–40)
Albumin/Globulin Ratio: 1.4 (ref 1.2–2.2)
Albumin: 4.5 g/dL (ref 3.9–4.9)
Alkaline Phosphatase: 119 IU/L (ref 44–121)
BUN/Creatinine Ratio: 15 (ref 9–23)
BUN: 10 mg/dL (ref 6–24)
Bilirubin Total: 0.3 mg/dL (ref 0.0–1.2)
CO2: 24 mmol/L (ref 20–29)
Calcium: 9.7 mg/dL (ref 8.7–10.2)
Chloride: 99 mmol/L (ref 96–106)
Creatinine, Ser: 0.67 mg/dL (ref 0.57–1.00)
Globulin, Total: 3.2 g/dL (ref 1.5–4.5)
Glucose: 103 mg/dL — ABNORMAL HIGH (ref 70–99)
Potassium: 4.3 mmol/L (ref 3.5–5.2)
Sodium: 137 mmol/L (ref 134–144)
Total Protein: 7.7 g/dL (ref 6.0–8.5)
eGFR: 107 mL/min/{1.73_m2} (ref >59)

## 2023-02-19 LAB — HEMOGLOBIN A1C
Est. average glucose Bld gHb Est-mCnc: 151 mg/dL
Hgb A1c MFr Bld: 6.9 % — ABNORMAL HIGH (ref 4.8–5.6)

## 2023-02-19 LAB — FOLLICLE STIMULATING HORMONE: FSH: 4.5 m[IU]/mL

## 2023-02-24 ENCOUNTER — Encounter: Payer: Self-pay | Admitting: Internal Medicine

## 2023-02-25 ENCOUNTER — Other Ambulatory Visit: Payer: Self-pay

## 2023-02-25 DIAGNOSIS — E1165 Type 2 diabetes mellitus with hyperglycemia: Secondary | ICD-10-CM

## 2023-02-25 DIAGNOSIS — E785 Hyperlipidemia, unspecified: Secondary | ICD-10-CM

## 2023-02-25 MED ORDER — DEXCOM G7 SENSOR MISC
3 refills | Status: AC
Start: 2023-02-25 — End: ?

## 2023-05-10 ENCOUNTER — Other Ambulatory Visit: Payer: Self-pay

## 2023-05-10 DIAGNOSIS — I10 Essential (primary) hypertension: Secondary | ICD-10-CM

## 2023-05-10 MED ORDER — METFORMIN HCL 500 MG PO TABS: ORAL_TABLET | ORAL | 1 refills | Status: DC

## 2023-05-10 MED ORDER — SPIRONOLACTONE 50 MG PO TABS
ORAL_TABLET | ORAL | 1 refills | Status: DC
Start: 2023-05-10 — End: 2023-10-17

## 2023-05-10 MED ORDER — OLMESARTAN MEDOXOMIL 40 MG PO TABS
ORAL_TABLET | ORAL | 1 refills | Status: DC
Start: 2023-05-10 — End: 2023-10-17

## 2023-06-26 NOTE — Patient Instructions (Signed)

## 2023-06-27 ENCOUNTER — Encounter: Payer: Self-pay | Admitting: Internal Medicine

## 2023-06-27 ENCOUNTER — Ambulatory Visit: Payer: No Typology Code available for payment source | Admitting: Internal Medicine

## 2023-06-27 VITALS — BP 108/80 | HR 84 | Ht 66.0 in | Wt 229.6 lb

## 2023-06-27 DIAGNOSIS — I10 Essential (primary) hypertension: Secondary | ICD-10-CM

## 2023-06-27 DIAGNOSIS — Z6836 Body mass index (BMI) 36.0-36.9, adult: Secondary | ICD-10-CM

## 2023-06-27 DIAGNOSIS — E1169 Type 2 diabetes mellitus with other specified complication: Secondary | ICD-10-CM | POA: Diagnosis not present

## 2023-06-27 DIAGNOSIS — E785 Hyperlipidemia, unspecified: Secondary | ICD-10-CM | POA: Diagnosis not present

## 2023-06-27 DIAGNOSIS — M2550 Pain in unspecified joint: Secondary | ICD-10-CM | POA: Insufficient documentation

## 2023-06-27 DIAGNOSIS — E559 Vitamin D deficiency, unspecified: Secondary | ICD-10-CM | POA: Insufficient documentation

## 2023-06-27 DIAGNOSIS — Z0000 Encounter for general adult medical examination without abnormal findings: Secondary | ICD-10-CM | POA: Diagnosis not present

## 2023-06-27 DIAGNOSIS — Z7985 Long-term (current) use of injectable non-insulin antidiabetic drugs: Secondary | ICD-10-CM

## 2023-06-27 LAB — POCT URINALYSIS DIPSTICK
Bilirubin, UA: NEGATIVE
Glucose, UA: NEGATIVE
Ketones, UA: NEGATIVE
Leukocytes, UA: NEGATIVE
Nitrite, UA: NEGATIVE
Protein, UA: NEGATIVE
Spec Grav, UA: >=1.03 — AB (ref 1.010–1.025)
Urobilinogen, UA: 0.2 U/dL
pH, UA: 5.5 (ref 5.0–8.0)

## 2023-06-27 MED ORDER — MOUNJARO 5 MG/0.5ML ~~LOC~~ SOAJ: 5.0000 mg | SUBCUTANEOUS | 0 refills | Status: DC

## 2023-06-27 NOTE — Assessment & Plan Note (Signed)
She has recently joined [employer].  She is encouraged to strive for BMI less than 30 to decrease cardiac risk. Advised to aim for at least 150 minutes of exercise per week.

## 2023-06-27 NOTE — Progress Notes (Signed)
I,Victoria T Deloria Lair, CMA,acting as a Neurosurgeon for Gwynneth Aliment, MD.,have documented all relevant documentation on the behalf of Gwynneth Aliment, MD,as directed by  Gwynneth Aliment, MD while in the presence of Gwynneth Aliment, MD.  Subjective:    Patient ID: Kristen Wise , female    DOB: 1976/08/20 , 47 y.o.   MRN: [id number]  Chief Complaint  Patient presents with   Annual Exam   Diabetes   Hypertension    HPI  She is here today for a full physical examination. She is followed by GYN for her pelvic examinations in March. She reports wanting to discuss her progress from being followed at a weight management clinic. She reports hormone levels are unstable. She reports wanting to insight on increasing dosage of Ozempic.  Diabetes She presents for her follow-up diabetic visit. She has type 2 diabetes mellitus. Her disease course has been stable. There are no hypoglycemic associated symptoms. Pertinent negatives for diabetes include no blurred vision. There are no hypoglycemic complications. Risk factors for coronary artery disease include diabetes mellitus, dyslipidemia, obesity, hypertension and sedentary lifestyle. She is following a diabetic diet. She participates in exercise intermittently. Her breakfast blood glucose is taken between 9-10 am. Her breakfast blood glucose range is generally 110-130 mg/dl.  Hypertension This is a chronic problem. The current episode started more than 1 year ago. The problem has been gradually improving since onset. Pertinent negatives include no blurred vision.     Past Medical History:  Diagnosis Date   Diabetes mellitus without complication (HCC)    Fibroid 08/01/2011   utrerine   Gonorrhea    H/O psoriasis    History of chicken pox    History of irregular menstrual bleeding    Hypertension      Family History  Problem Relation Age of Onset   Diabetes Maternal Grandmother    Breast cancer Maternal Aunt    Breast cancer Cousin 40    Hypertension Mother    Diabetes Father    Hyperlipidemia Father    Seizures Neg Hx      Current Outpatient Medications:    acetaminophen (TYLENOL) 325 MG tablet, Take by mouth., Disp: , Rfl:    amLODipine (NORVASC) 10 MG tablet, TAKE 1 TABLET(10 MG) BY MOUTH DAILY, Disp: 90 tablet, Rfl: 1   atorvastatin (LIPITOR) 20 MG tablet, Take 1 tablet (20 mg total) by mouth daily., Disp: 90 tablet, Rfl: 2   clobetasol ointment (TEMOVATE) 0.05 %, clobetasol 0.05 % topical ointment  APPLY TO SCALP 3 TIMES PER WEEK AS NEEDED. NOT TO FACE., Disp: , Rfl:    Continuous Glucose Sensor (DEXCOM G7 SENSOR) MISC, Use as directed to monitor blood sugars, Disp: 2 each, Rfl: 3   metFORMIN (GLUCOPHAGE) 500 MG tablet, TAKE 1 TABLET(500 MG) BY MOUTH TWICE DAILY WITH A MEAL, Disp: 180 tablet, Rfl: 1   olmesartan (BENICAR) 40 MG tablet, TAKE 1 TABLET(40 MG) BY MOUTH DAILY, Disp: 90 tablet, Rfl: 1   omeprazole (PRILOSEC) 40 MG capsule, One capsule po qd prn, Disp: 90 capsule, Rfl: 1   spironolactone (ALDACTONE) 50 MG tablet, TAKE 1 TABLET(50 MG) BY MOUTH DAILY, Disp: 90 tablet, Rfl: 1   tirzepatide (MOUNJARO) 5 MG/0.5ML Pen, Inject 5 mg into the skin once a week., Disp: 2 mL, Rfl: 0   cetirizine (ZYRTEC ALLERGY) 10 MG tablet, Take 1 tablet (10 mg total) by mouth daily., Disp: 30 tablet, Rfl: 2   Allergies  Allergen Reactions   Lisinopril Other (  See Comments)      The patient states she uses status post hysterectomy for birth control. Patient's last menstrual period was 07/24/2018.. Negative for Dysmenorrhea. Negative for: breast discharge, breast lump(s), breast pain and breast self exam. Associated symptoms include abnormal vaginal bleeding. Pertinent negatives include abnormal bleeding (hematology), anxiety, decreased libido, depression, difficulty falling sleep, dyspareunia, history of infertility, nocturia, sexual dysfunction, sleep disturbances, urinary incontinence, urinary urgency, vaginal discharge and vaginal  itching. Diet regular.The patient states her exercise level is intermittent.  . The patient's tobacco use is:  Social History   Tobacco Use  Smoking Status Never  Smokeless Tobacco Never  . She has been exposed to passive smoke. The patient's alcohol use is:  Social History   Substance and Sexual Activity  Alcohol Use No    Review of Systems  Constitutional: Negative.   HENT: Negative.    Eyes: Negative.  Negative for blurred vision.  Respiratory: Negative.    Cardiovascular: Negative.   Gastrointestinal: Negative.   Endocrine: Negative.   Genitourinary: Negative.   Musculoskeletal: Negative.   Skin: Negative.   Allergic/Immunologic: Negative.   Neurological: Negative.   Hematological: Negative.   Psychiatric/Behavioral: Negative.       Today's Vitals   06/27/23 0836  BP: 108/80  Pulse: 84  SpO2: 98%  Weight: 229 lb 9.6 oz (104.1 kg)  Height: 5\' 6"  (1.676 m)   Body mass index is 37.06 kg/m.  Wt Readings from Last 3 Encounters:  06/27/23 229 lb 9.6 oz (104.1 kg)  02/18/23 232 lb 6.4 oz (105.4 kg)  10/23/22 228 lb 12.8 oz (103.8 kg)     Objective:  Physical Exam Vitals and nursing note reviewed.  Constitutional:      Appearance: Normal appearance. She is obese.  HENT:     Head: Normocephalic and atraumatic.     Right Ear: Tympanic membrane, ear canal and external ear normal.     Left Ear: Tympanic membrane, ear canal and external ear normal.     Nose:     Comments: Masked     Mouth/Throat:     Comments: Masked  Eyes:     Extraocular Movements: Extraocular movements intact.     Conjunctiva/sclera: Conjunctivae normal.     Pupils: Pupils are equal, round, and reactive to light.  Cardiovascular:     Rate and Rhythm: Normal rate and regular rhythm.     Pulses: Normal pulses.     Heart sounds: Normal heart sounds.  Pulmonary:     Effort: Pulmonary effort is normal.     Breath sounds: Normal breath sounds.  Chest:  Breasts:    Tanner Score is 5.      Right: Normal.     Left: Normal.     Comments: Keloid scarring on b/l breasts Abdominal:     General: Abdomen is flat. Bowel sounds are normal.     Palpations: Abdomen is soft.  Genitourinary:    Comments: deferred Musculoskeletal:        General: Normal range of motion.     Cervical back: Normal range of motion and neck supple.  Skin:    General: Skin is warm and dry.     Comments: Keloid   Neurological:     General: No focal deficit present.     Mental Status: She is alert and oriented to person, place, and time.  Psychiatric:        Mood and Affect: Mood normal.        Behavior: Behavior normal.  Assessment And Plan:     Annual physical exam Assessment & Plan: A full exam was performed.  Importance of monthly self breast exams was discussed with the patient.  She is advised to get 30-45 minutes of regular exercise, no less than four to five days per week. Both weight-bearing and aerobic exercises are recommended.  She is advised to follow a healthy diet with at least six fruits/veggies per day, decrease intake of red meat and other saturated fats and to increase fish intake to twice weekly.  Meats/fish should not be fried -- baked, boiled or broiled is preferable. It is also important to cut back on your sugar intake.  Be sure to read labels - try to avoid anything with added sugar, high fructose corn syrup or other sweeteners.  If you must use a sweetener, you can try stevia or monkfruit.  It is also important to avoid artificially sweetened foods/beverages and diet drinks. Lastly, wear SPF 50 sunscreen on exposed skin and when in direct sunlight for an extended period of time.  Be sure to avoid fast food restaurants and aim for at least 60 ounces of water daily.       Dyslipidemia due to type 2 diabetes mellitus (HCC) Assessment & Plan: Chronic, diabetic foot exam was performed.  We will switch to [facility] to achieve greater weight loss benefit. Encouraged to incorporate  strength training into her exercise routine. I DISCUSSED WITH THE PATIENT AT LENGTH REGARDING THE GOALS OF GLYCEMIC CONTROL AND POSSIBLE LONG-TERM COMPLICATIONS.  I  ALSO STRESSED THE IMPORTANCE OF COMPLIANCE WITH HOME GLUCOSE MONITORING, DIETARY RESTRICTIONS INCLUDING AVOIDANCE OF SUGARY DRINKS/PROCESSED FOODS,  ALONG WITH REGULAR EXERCISE.  I  ALSO STRESSED THE IMPORTANCE OF ANNUAL EYE EXAMS, SELF FOOT CARE AND COMPLIANCE WITH OFFICE VISITS.   Orders: -     Ambulatory referral to Ophthalmology  Essential hypertension, benign Assessment & Plan: Chronic, well controlled. EKG performed, NSR w/o acute changes. She will continue with olmesartan 40mg  daily, spironolactone 50mg  daily and amlodipine 10mg  daily. She is encouraged to follow low sodium diet. She will f/u in four months for re-evaluation.   Orders: -     POCT urinalysis dipstick -     Microalbumin / creatinine urine ratio -     EKG 12-Lead  Class 2 severe obesity due to excess calories with serious comorbidity and body mass index (BMI) of 36.0 to 36.9 in adult [facility]) Assessment & Plan: She has recently joined [employer].  She is encouraged to strive for BMI less than 30 to decrease cardiac risk. Advised to aim for at least 150 minutes of exercise per week.    Other orders -     Mounjaro; Inject 5 mg into the skin once a week.  Dispense: 2 mL; Refill: 0  She is encouraged to strive for BMI less than 30 to decrease cardiac risk. Advised to aim for at least 150 minutes of exercise per week.    Return in 10 weeks (on 09/05/2023) for 1 year physical. Patient was given opportunity to ask questions. Patient verbalized understanding of the plan and was able to repeat key elements of the plan. All questions were answered to their satisfaction.    I, Gwynneth Aliment, MD, have reviewed all documentation for this visit. The documentation on 06/27/23 for the exam, diagnosis, procedures, and orders are all accurate and complete.

## 2023-06-27 NOTE — Assessment & Plan Note (Signed)

## 2023-06-27 NOTE — Assessment & Plan Note (Addendum)
Chronic, well controlled. EKG performed, NSR w/o acute changes. She will continue with olmesartan 40mg  daily, spironolactone 50mg  daily and amlodipine 10mg  daily. She is encouraged to follow low sodium diet. She will f/u in four months for re-evaluation.

## 2023-06-27 NOTE — Assessment & Plan Note (Addendum)
Chronic, diabetic foot exam was performed.  We will switch to [facility] to achieve greater weight loss benefit. Encouraged to incorporate strength training into her exercise routine. I DISCUSSED WITH THE PATIENT AT LENGTH REGARDING THE GOALS OF GLYCEMIC CONTROL AND POSSIBLE LONG-TERM COMPLICATIONS.  I  ALSO STRESSED THE IMPORTANCE OF COMPLIANCE WITH HOME GLUCOSE MONITORING, DIETARY RESTRICTIONS INCLUDING AVOIDANCE OF SUGARY DRINKS/PROCESSED FOODS,  ALONG WITH REGULAR EXERCISE.  I  ALSO STRESSED THE IMPORTANCE OF ANNUAL EYE EXAMS, SELF FOOT CARE AND COMPLIANCE WITH OFFICE VISITS.

## 2023-06-28 ENCOUNTER — Encounter: Payer: Self-pay | Admitting: Internal Medicine

## 2023-07-19 ENCOUNTER — Encounter: Payer: Self-pay | Admitting: Internal Medicine

## 2023-07-19 ENCOUNTER — Other Ambulatory Visit: Payer: Self-pay

## 2023-07-19 MED ORDER — MOUNJARO 5 MG/0.5ML ~~LOC~~ SOAJ: 5.0000 mg | SUBCUTANEOUS | 0 refills | Status: DC

## 2023-08-13 ENCOUNTER — Other Ambulatory Visit: Payer: Self-pay | Admitting: Internal Medicine

## 2023-08-13 DIAGNOSIS — I10 Essential (primary) hypertension: Secondary | ICD-10-CM

## 2023-08-14 LAB — HM DIABETES EYE EXAM

## 2023-08-21 ENCOUNTER — Ambulatory Visit: Payer: No Typology Code available for payment source | Admitting: Internal Medicine

## 2023-09-03 ENCOUNTER — Encounter: Payer: Self-pay | Admitting: Internal Medicine

## 2023-09-09 ENCOUNTER — Ambulatory Visit: Payer: No Typology Code available for payment source | Admitting: Internal Medicine

## 2023-09-11 ENCOUNTER — Other Ambulatory Visit: Payer: Self-pay | Admitting: Internal Medicine

## 2023-09-11 MED ORDER — MOUNJARO 5 MG/0.5ML ~~LOC~~ SOAJ: 5.0000 mg | SUBCUTANEOUS | 0 refills | Status: DC

## 2023-09-20 ENCOUNTER — Other Ambulatory Visit: Payer: Self-pay | Admitting: Internal Medicine

## 2023-10-03 ENCOUNTER — Other Ambulatory Visit: Payer: Self-pay

## 2023-10-03 DIAGNOSIS — E785 Hyperlipidemia, unspecified: Secondary | ICD-10-CM

## 2023-10-07 ENCOUNTER — Other Ambulatory Visit: Payer: No Typology Code available for payment source

## 2023-10-10 ENCOUNTER — Other Ambulatory Visit: Payer: Self-pay | Admitting: Unknown Physician Specialty

## 2023-10-10 DIAGNOSIS — H93A1 Pulsatile tinnitus, right ear: Secondary | ICD-10-CM

## 2023-10-10 LAB — MICROALBUMIN / CREATININE URINE RATIO
Creatinine, Urine: 155 mg/dL
Microalb/Creat Ratio: 3 mg/g{creat} (ref 0–29)
Microalbumin, Urine: 5.3 ug/mL

## 2023-10-16 ENCOUNTER — Other Ambulatory Visit: Payer: Self-pay | Admitting: Internal Medicine

## 2023-10-16 DIAGNOSIS — E042 Nontoxic multinodular goiter: Secondary | ICD-10-CM

## 2023-10-16 DIAGNOSIS — I10 Essential (primary) hypertension: Secondary | ICD-10-CM

## 2023-10-16 HISTORY — DX: Nontoxic multinodular goiter: E04.2

## 2023-10-21 ENCOUNTER — Other Ambulatory Visit: Payer: Self-pay | Admitting: Internal Medicine

## 2023-10-21 DIAGNOSIS — E1169 Type 2 diabetes mellitus with other specified complication: Secondary | ICD-10-CM

## 2023-10-23 ENCOUNTER — Encounter: Payer: Self-pay | Admitting: Unknown Physician Specialty

## 2023-10-30 ENCOUNTER — Ambulatory Visit
Admission: RE | Admit: 2023-10-30 | Discharge: 2023-10-30 | Disposition: A | Discharge status: Home / Self Care | Payer: No Typology Code available for payment source | Source: Ambulatory Visit | Attending: Unknown Physician Specialty | Admitting: Unknown Physician Specialty

## 2023-10-30 DIAGNOSIS — H93A1 Pulsatile tinnitus, right ear: Secondary | ICD-10-CM

## 2023-11-03 IMAGING — MG MM DIGITAL SCREENING BILAT W/ TOMO AND CAD
6 of 10 series · 6 of 30 positions shown · non-contrast
Comparison: Previous exam(s).

CLINICAL DATA: Screening.

EXAM:
DIGITAL SCREENING BILATERAL MAMMOGRAM WITH TOMOSYNTHESIS AND CAD
TECHNIQUE: Bilateral screening digital craniocaudal and mediolateral oblique
mammograms were obtained. Bilateral screening digital breast
tomosynthesis was performed. The images were evaluated with
computer-aided detection.

[R MLO synth-2D]
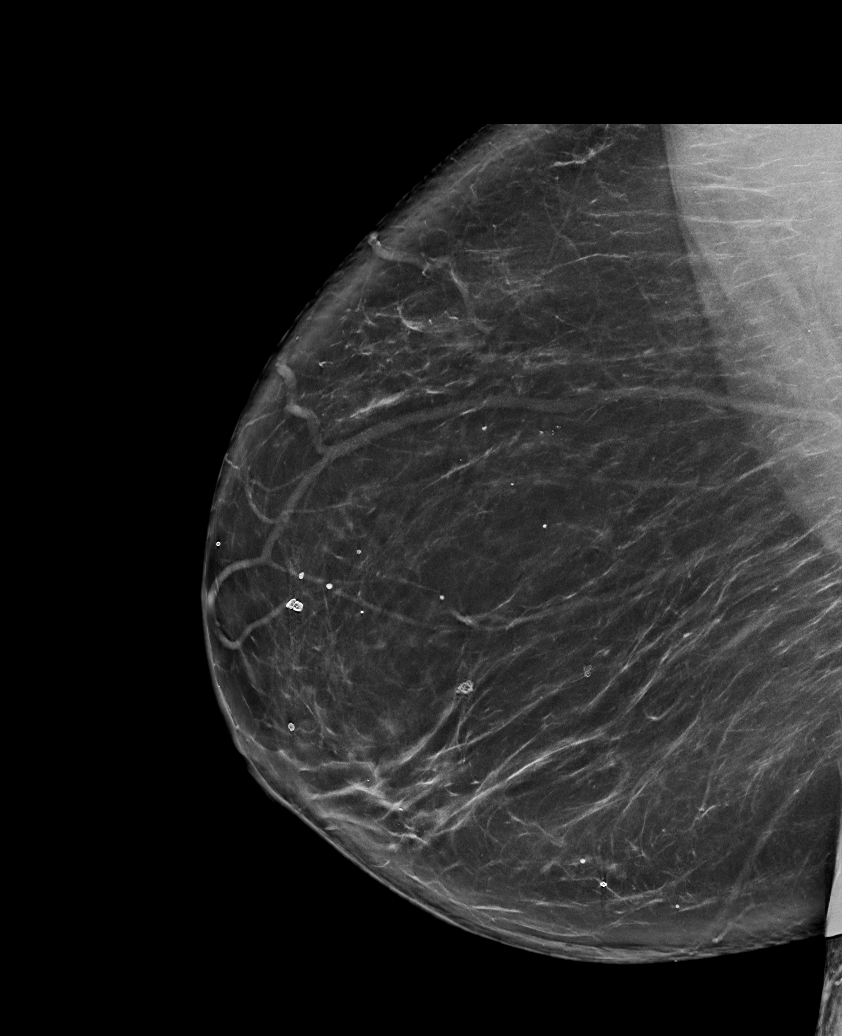

[L CC synth-2D]
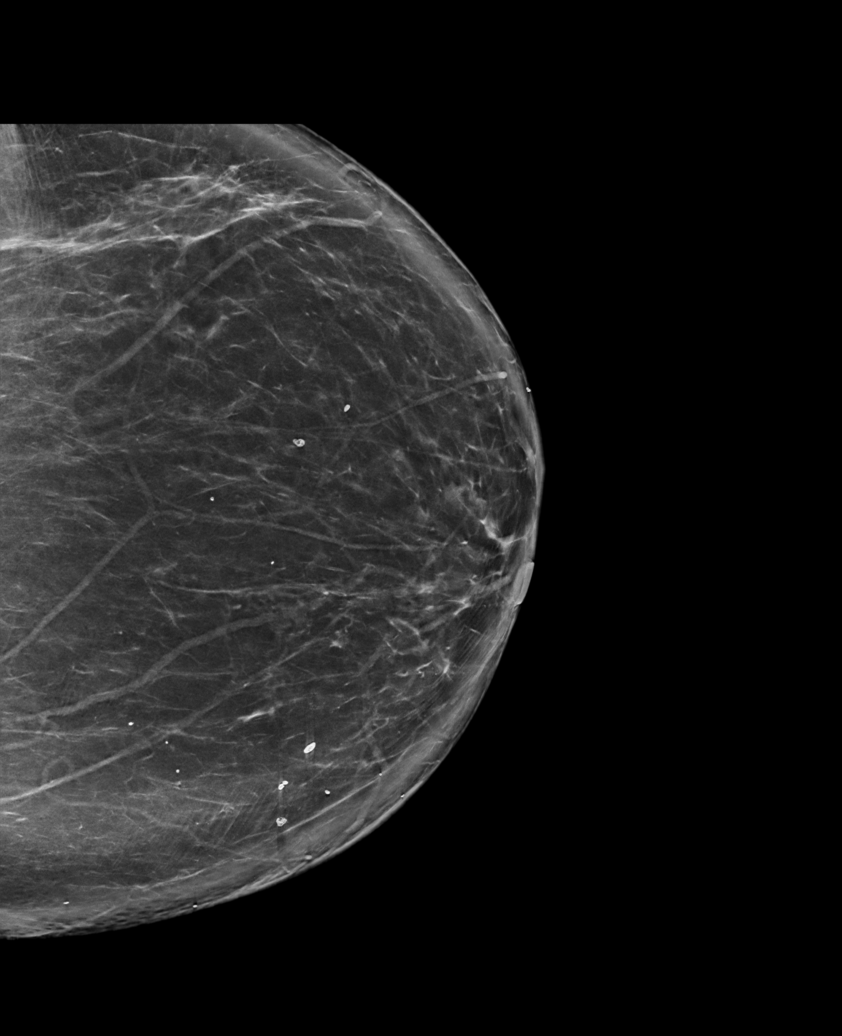

[L MLO synth-2D (1 of 2)]
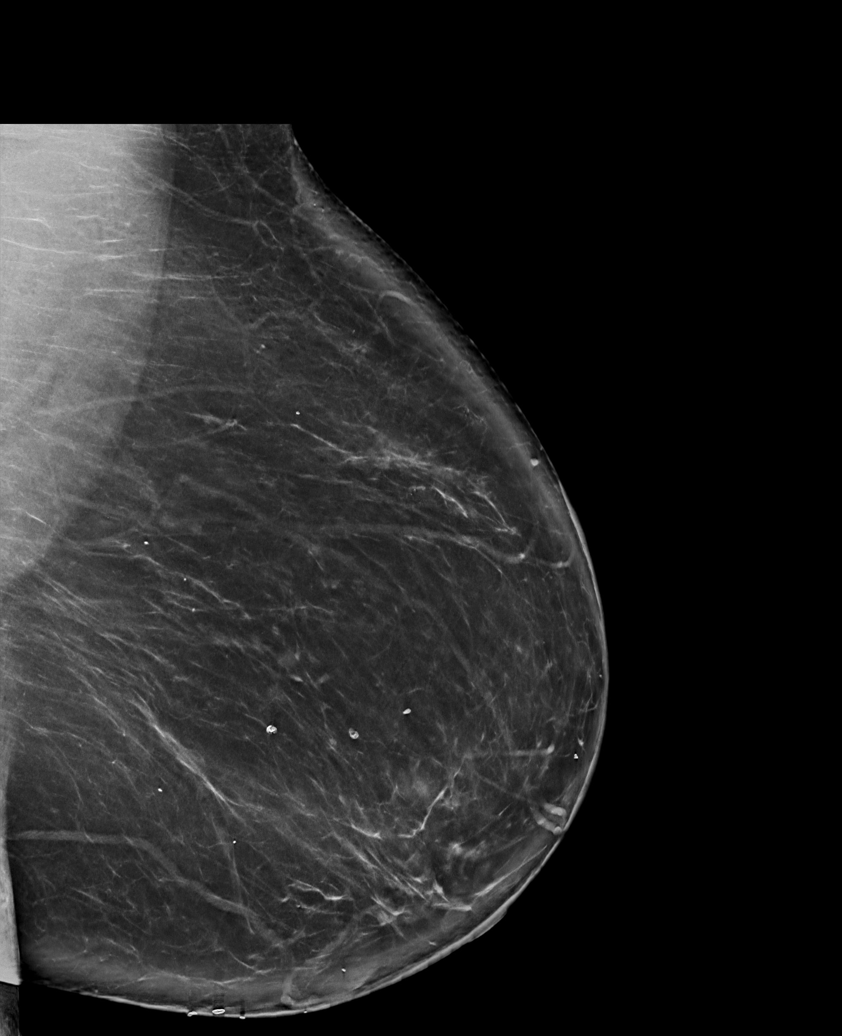

[R CC synth-2D]
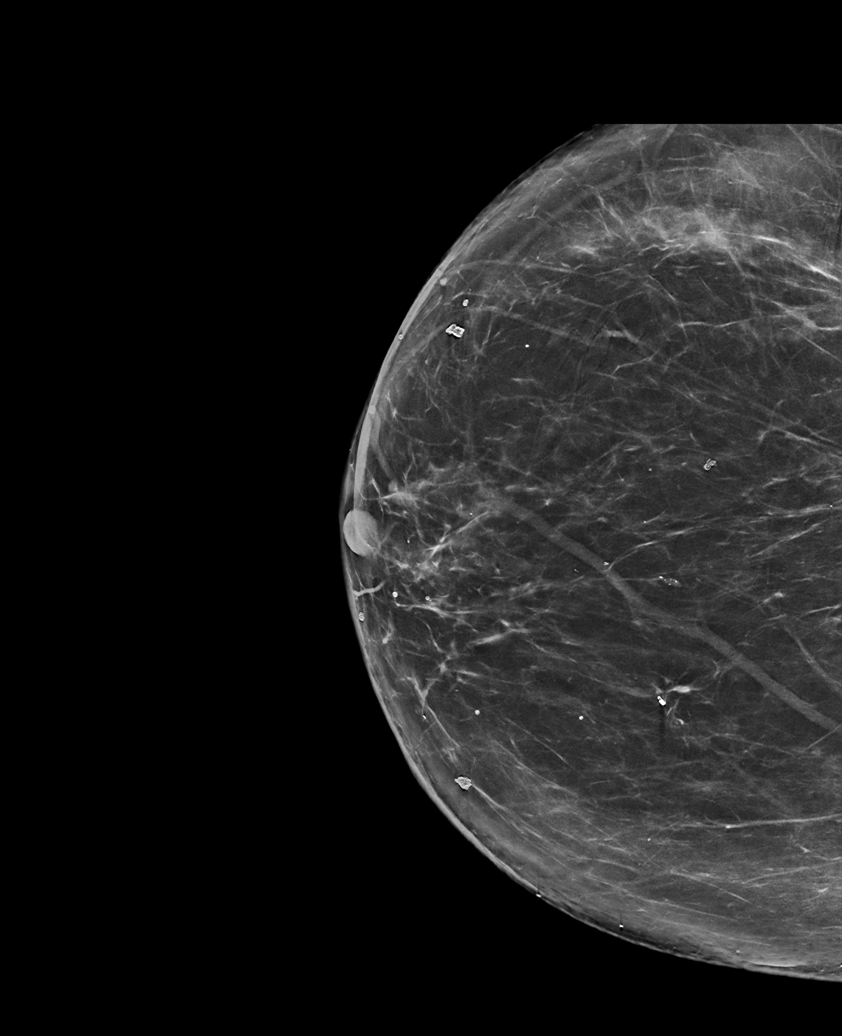

[L MLO synth-2D (2 of 2)]
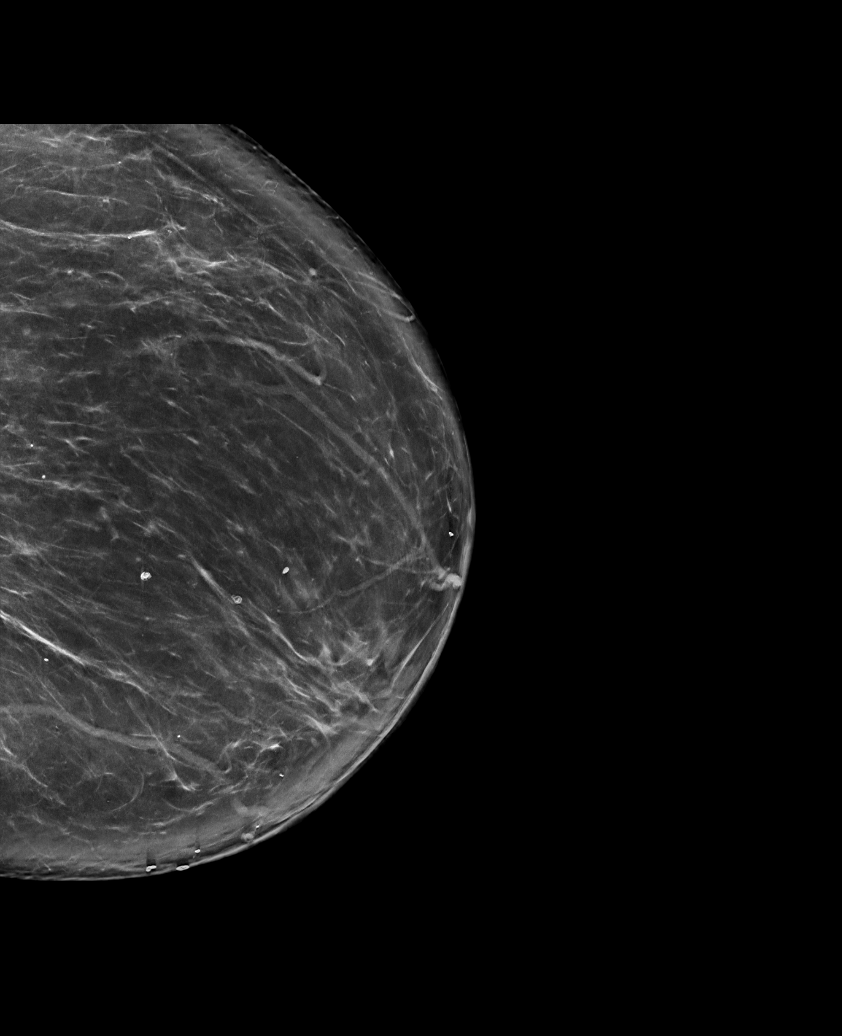

[R CC tomo · tomo slice 49/98.0]
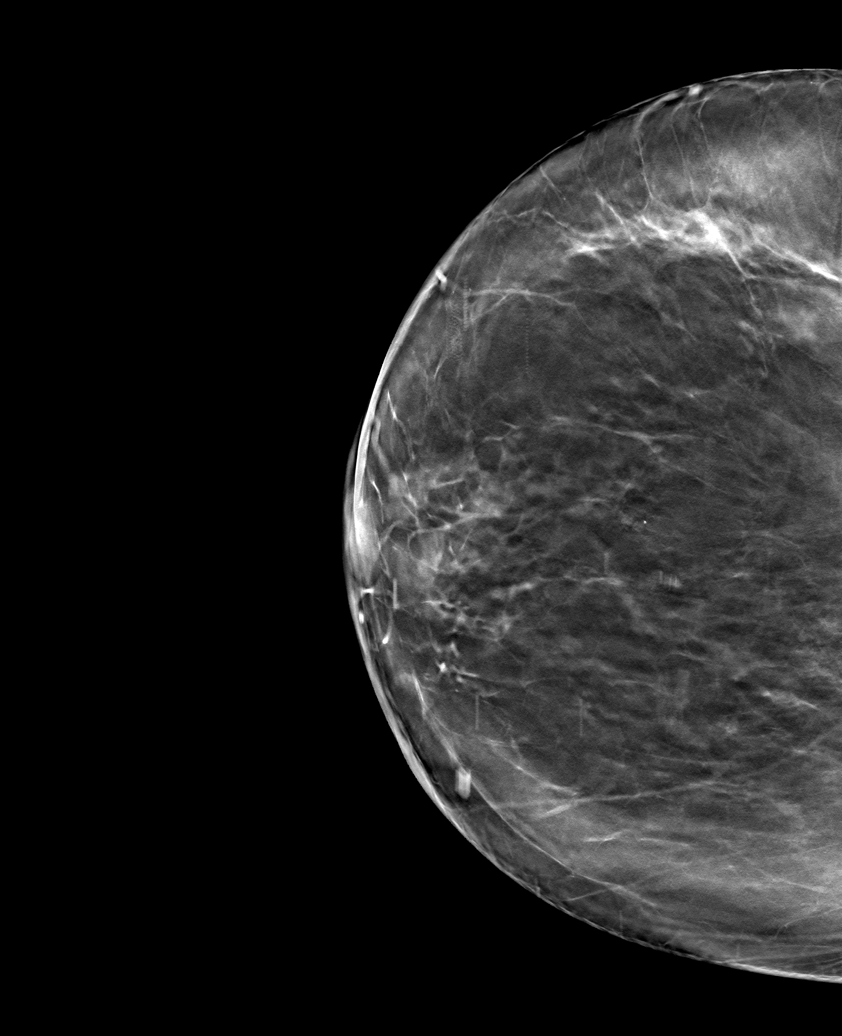

[6 of 30 positions shown; findings below may reference images not displayed]

ACR Breast Density Category b: There are scattered areas of
fibroglandular density.
FINDINGS: There are no findings suspicious for malignancy.
IMPRESSION: No mammographic evidence of malignancy. A result letter of this
screening mammogram will be mailed directly to the patient.

RECOMMENDATION:
Screening mammogram in one year. (Code:51-O-LD2)

BI-RADS CATEGORY  1: Negative.

## 2023-11-07 ENCOUNTER — Encounter: Payer: Self-pay | Admitting: Internal Medicine

## 2023-11-07 ENCOUNTER — Ambulatory Visit: Payer: No Typology Code available for payment source | Admitting: Internal Medicine

## 2023-11-07 ENCOUNTER — Other Ambulatory Visit: Payer: Self-pay

## 2023-11-07 VITALS — BP 108/80 | HR 86 | Temp 98.2°F | Ht 66.0 in | Wt 236.0 lb

## 2023-11-07 DIAGNOSIS — E785 Hyperlipidemia, unspecified: Secondary | ICD-10-CM | POA: Diagnosis not present

## 2023-11-07 DIAGNOSIS — E1169 Type 2 diabetes mellitus with other specified complication: Secondary | ICD-10-CM | POA: Diagnosis not present

## 2023-11-07 DIAGNOSIS — I10 Essential (primary) hypertension: Secondary | ICD-10-CM | POA: Diagnosis not present

## 2023-11-07 DIAGNOSIS — H93A1 Pulsatile tinnitus, right ear: Secondary | ICD-10-CM | POA: Diagnosis not present

## 2023-11-07 DIAGNOSIS — E66812 Obesity, class 2: Secondary | ICD-10-CM

## 2023-11-07 DIAGNOSIS — E041 Nontoxic single thyroid nodule: Secondary | ICD-10-CM

## 2023-11-07 DIAGNOSIS — Z6838 Body mass index (BMI) 38.0-38.9, adult: Secondary | ICD-10-CM

## 2023-11-07 NOTE — Patient Instructions (Signed)

## 2023-11-07 NOTE — Progress Notes (Signed)
I,Victoria T Deloria Lair, CMA,acting as a Neurosurgeon for Gwynneth Aliment, MD.,have documented all relevant documentation on the behalf of Gwynneth Aliment, MD,as directed by  Gwynneth Aliment, MD while in the presence of Gwynneth Aliment, MD.  Subjective:  Patient ID: Kristen Wise , female    DOB: 13-Mar-1976 , 48 y.o.   MRN: [id number]  Chief Complaint  Patient presents with   Diabetes   Hypertension    HPI  Patient presents today for a DM/HTN f/u. Patient reports compliance with her meds. She denies having any headaches, cp and sob.  She would like to discuss MRI & ultrasound recently completed.  She states tests were ordered by her ENT.      Diabetes She presents for her follow-up diabetic visit. She has type 2 diabetes mellitus. There are no hypoglycemic associated symptoms. Pertinent negatives for diabetes include no blurred vision and no chest pain. There are no hypoglycemic complications. Risk factors for coronary artery disease include diabetes mellitus, dyslipidemia, obesity, hypertension and sedentary lifestyle. She is following a diabetic diet. She participates in exercise intermittently. Her breakfast blood glucose is taken between 9-10 am. Her breakfast blood glucose range is generally 110-130 mg/dl.  Hypertension This is a chronic problem. The current episode started more than 1 year ago. The problem has been gradually improving since onset. Pertinent negatives include no blurred vision, chest pain, palpitations or shortness of breath.  Hyperlipidemia Pertinent negatives include no chest pain or shortness of breath.     Past Medical History:  Diagnosis Date   Diabetes mellitus without complication (HCC)    Fibroid 08/01/2011   utrerine   Gonorrhea    H/O psoriasis    History of chicken pox    History of irregular menstrual bleeding    Hypertension      Family History  Problem Relation Age of Onset   Diabetes Maternal Grandmother    Breast cancer Maternal Aunt     Breast cancer Cousin 40   Hypertension Mother    Diabetes Father    Hyperlipidemia Father    Seizures Neg Hx      Current Outpatient Medications:    acetaminophen (TYLENOL) 325 MG tablet, Take by mouth., Disp: , Rfl:    amLODipine (NORVASC) 10 MG tablet, TAKE 1 TABLET BY MOUTH DAILY, Disp: 90 tablet, Rfl: 3   atorvastatin (LIPITOR) 20 MG tablet, TAKE 1 TABLET BY MOUTH DAILY, Disp: 90 tablet, Rfl: 3   clobetasol ointment (TEMOVATE) 0.05 %, clobetasol 0.05 % topical ointment  APPLY TO SCALP 3 TIMES PER WEEK AS NEEDED. NOT TO FACE., Disp: , Rfl:    Continuous Glucose Sensor (DEXCOM G7 SENSOR) MISC, Use as directed to monitor blood sugars, Disp: 2 each, Rfl: 3   metFORMIN (GLUCOPHAGE) 500 MG tablet, TAKE 1 TABLET(500 MG) BY MOUTH TWICE DAILY WITH A MEAL, Disp: 180 tablet, Rfl: 1   olmesartan (BENICAR) 40 MG tablet, TAKE 1 TABLET BY MOUTH DAILY, Disp: 90 tablet, Rfl: 3   spironolactone (ALDACTONE) 50 MG tablet, TAKE 1 TABLET BY MOUTH DAILY, Disp: 90 tablet, Rfl: 3   tirzepatide (MOUNJARO) 5 MG/0.5ML Pen, Inject 5 mg into the skin once a week., Disp: 2 mL, Rfl: 0   cetirizine (ZYRTEC ALLERGY) 10 MG tablet, Take 1 tablet (10 mg total) by mouth daily., Disp: 30 tablet, Rfl: 2   omeprazole (PRILOSEC) 40 MG capsule, One capsule po qd prn (Patient not taking: Reported on 11/07/2023), Disp: 90 capsule, Rfl: 1   Allergies  Allergen  Reactions   Lisinopril Other (See Comments)     Review of Systems  Constitutional: Negative.   Eyes:  Negative for blurred vision.  Respiratory: Negative.  Negative for shortness of breath.   Cardiovascular: Negative.  Negative for chest pain and palpitations.  Neurological: Negative.   Psychiatric/Behavioral: Negative.       Today's Vitals   11/07/23 1538  BP: 108/80  Pulse: 86  Temp: 98.2 F (36.8 C)  SpO2: 98%  Weight: 236 lb (107 kg)  Height: 5\' 6"  (1.676 m)   Body mass index is 38.09 kg/m.  Wt Readings from Last 3 Encounters:  11/07/23 236 lb (107  kg)  06/27/23 229 lb 9.6 oz (104.1 kg)  02/18/23 232 lb 6.4 oz (105.4 kg)     Objective:  Physical Exam Vitals and nursing note reviewed.  Constitutional:      Appearance: Normal appearance.  HENT:     Head: Normocephalic and atraumatic.  Eyes:     Extraocular Movements: Extraocular movements intact.  Neck:     Thyroid: Thyromegaly present.  Cardiovascular:     Rate and Rhythm: Normal rate and regular rhythm.     Heart sounds: Normal heart sounds.  Pulmonary:     Effort: Pulmonary effort is normal.     Breath sounds: Normal breath sounds.  Musculoskeletal:     Cervical back: Normal range of motion.  Skin:    General: Skin is warm.  Neurological:     General: No focal deficit present.     Mental Status: She is alert.  Psychiatric:        Mood and Affect: Mood normal.        Behavior: Behavior normal.     IMAGING:  EXAM: MRA HEAD WITHOUT CONTRAST   TECHNIQUE: Angiographic images of the Circle of Willis were acquired using MRA technique without intravenous contrast.   COMPARISON:  None Available.   FINDINGS: Anterior circulation: The RICA is tortuous and below the skull base but no beading or dissection flap to implicate turbulent flow causing symptoms. Vessels of the anterior circulation are smoothly contoured and widely patent without aneurysm or vascular malformation.   Posterior circulation: Dominant left vertebral artery with most of right vertebral flow into the right PICA. No indication of acquired stenosis, branch occlusion, beading, aneurysm, or vascular malformation.   No arterialized venous flow.   IMPRESSION: Negative MRA, no explanation for tinnitus.    Assessment And Plan:  Dyslipidemia due to type 2 diabetes mellitus (HCC) Assessment & Plan: Chronic, LDL goal is less than 70.  She will continue with atorvastatin 20mg  daily.  She is now on Mounjaro 5mg  weekly and metformin 500mg  twice daily.  Encouraged to incorporate strength training into  her exercise routine.   Orders: -     CMP14+EGFR; Future -     Hemoglobin A1c; Future  Essential hypertension, benign Assessment & Plan: Chronic, well controlled. She will continue with olmesartan 40mg  daily, spironolactone 50mg  daily and amlodipine 10mg  daily. She is encouraged to follow low sodium diet. She will f/u in four months for re-evaluation.    Pulsatile tinnitus of right ear Assessment & Plan: Chronic, she has been evaluated by ENT.  Carotid ultrasound and MRI brain results reviewed in detail.    Thyroid nodule Assessment & Plan: Seen on carotid u/s. She agrees w/ thyroid ultrasound for further evaluation.   Orders: -     US THYROID; Future  Class 2 severe obesity due to excess calories with serious comorbidity and body  mass index (BMI) of 38.0 to 38.9 in adult [facility]) Assessment & Plan: She is no longer going to [place]. Plan to increase Mounjaro monthly as tolerated. Again, importance of regular exercise was discussed with the patient in detail.     She is encouraged to strive for BMI less than 30 to decrease cardiac risk. Advised to aim for at least 150 minutes of exercise per week.    Return for 4 MONTH DM & BPC.  Patient was given opportunity to ask questions. Patient verbalized understanding of the plan and was able to repeat key elements of the plan. All questions were answered to their satisfaction.    I, Gwynneth Aliment, MD, have reviewed all documentation for this visit. The documentation on 11/07/23 for the exam, diagnosis, procedures, and orders are all accurate and complete.   IF YOU HAVE BEEN REFERRED TO A SPECIALIST, IT MAY TAKE 1-2 WEEKS TO SCHEDULE/PROCESS THE REFERRAL. IF YOU HAVE NOT HEARD FROM US/SPECIALIST IN TWO WEEKS, PLEASE GIVE [place] A CALL AT [phone].   THE PATIENT IS ENCOURAGED TO PRACTICE SOCIAL DISTANCING DUE TO THE COVID-19 PANDEMIC.

## 2023-11-10 DIAGNOSIS — E041 Nontoxic single thyroid nodule: Secondary | ICD-10-CM | POA: Insufficient documentation

## 2023-11-10 NOTE — Assessment & Plan Note (Signed)
Chronic, well controlled. She will continue with olmesartan 40mg  daily, spironolactone 50mg  daily and amlodipine 10mg  daily. She is encouraged to follow low sodium diet. She will f/u in four months for re-evaluation.

## 2023-11-10 NOTE — Assessment & Plan Note (Signed)
Seen on carotid u/s. She agrees w/ thyroid ultrasound for further evaluation.

## 2023-11-10 NOTE — Assessment & Plan Note (Signed)
She is no longer going to [employer]. Plan to increase Mounjaro monthly as tolerated. Again, importance of regular exercise was discussed with the patient in detail.

## 2023-11-10 NOTE — Assessment & Plan Note (Signed)
Chronic, LDL goal is less than 70.  She will continue with atorvastatin 20mg  daily.  She is now on Mounjaro 5mg  weekly and metformin 500mg  twice daily.  Encouraged to incorporate strength training into her exercise routine.

## 2023-11-10 NOTE — Assessment & Plan Note (Signed)
Chronic, she has been evaluated by ENT.  Carotid ultrasound and MRI brain results reviewed in detail.

## 2023-11-15 ENCOUNTER — Encounter: Payer: Self-pay | Admitting: Internal Medicine

## 2023-11-15 ENCOUNTER — Other Ambulatory Visit: Payer: Self-pay | Admitting: Internal Medicine

## 2023-11-15 ENCOUNTER — Ambulatory Visit
Admission: RE | Admit: 2023-11-15 | Discharge: 2023-11-15 | Disposition: A | Discharge status: Home / Self Care | Payer: No Typology Code available for payment source | Source: Ambulatory Visit | Attending: Internal Medicine | Admitting: Internal Medicine

## 2023-11-15 DIAGNOSIS — E041 Nontoxic single thyroid nodule: Secondary | ICD-10-CM

## 2023-11-16 LAB — CMP14+EGFR
ALT: 24 [IU]/L (ref 0–32)
AST: 13 [IU]/L (ref 0–40)
Albumin: 4.3 g/dL (ref 3.9–4.9)
Alkaline Phosphatase: 117 [IU]/L (ref 44–121)
BUN/Creatinine Ratio: 19 (ref 9–23)
BUN: 15 mg/dL (ref 6–24)
Bilirubin Total: 0.3 mg/dL (ref 0.0–1.2)
CO2: 21 mmol/L (ref 20–29)
Calcium: 9.7 mg/dL (ref 8.7–10.2)
Chloride: 106 mmol/L (ref 96–106)
Creatinine, Ser: 0.81 mg/dL (ref 0.57–1.00)
Globulin, Total: 2.9 g/dL (ref 1.5–4.5)
Glucose: 122 mg/dL — ABNORMAL HIGH (ref 70–99)
Potassium: 4.1 mmol/L (ref 3.5–5.2)
Sodium: 135 mmol/L (ref 134–144)
Total Protein: 7.2 g/dL (ref 6.0–8.5)
eGFR: 89 mL/min/{1.73_m2} (ref >59)

## 2023-11-16 LAB — HEMOGLOBIN A1C
Est. average glucose Bld gHb Est-mCnc: 160 mg/dL
Hgb A1c MFr Bld: 7.2 % — ABNORMAL HIGH (ref 4.8–5.6)

## 2023-11-19 ENCOUNTER — Other Ambulatory Visit: Payer: Self-pay | Admitting: Unknown Physician Specialty

## 2023-11-19 DIAGNOSIS — E041 Nontoxic single thyroid nodule: Secondary | ICD-10-CM

## 2023-11-22 ENCOUNTER — Other Ambulatory Visit: Payer: Self-pay | Admitting: Internal Medicine

## 2023-11-23 LAB — TSH+FREE T4
Free T4: 1.41 ng/dL (ref 0.82–1.77)
TSH: 1.4 u[IU]/mL (ref 0.450–4.500)

## 2023-11-27 ENCOUNTER — Other Ambulatory Visit: Payer: Self-pay | Admitting: Internal Medicine

## 2023-11-28 ENCOUNTER — Encounter: Payer: Self-pay | Admitting: Internal Medicine

## 2023-11-28 MED ORDER — MOUNJARO 5 MG/0.5ML ~~LOC~~ SOAJ: 5.0000 mg | SUBCUTANEOUS | 0 refills | Status: DC

## 2023-12-09 ENCOUNTER — Other Ambulatory Visit: Payer: Self-pay | Admitting: Internal Medicine

## 2023-12-09 DIAGNOSIS — Z0000 Encounter for general adult medical examination without abnormal findings: Secondary | ICD-10-CM

## 2023-12-11 ENCOUNTER — Encounter: Payer: Self-pay | Admitting: Internal Medicine

## 2023-12-24 ENCOUNTER — Encounter: Payer: Self-pay | Admitting: Internal Medicine

## 2023-12-25 ENCOUNTER — Other Ambulatory Visit: Payer: Self-pay

## 2023-12-25 DIAGNOSIS — E785 Hyperlipidemia, unspecified: Secondary | ICD-10-CM

## 2023-12-25 MED ORDER — SEMAGLUTIDE(0.25 OR 0.5MG/DOS) 2 MG/3ML ~~LOC~~ SOPN: 0.5000 mg | PEN_INJECTOR | SUBCUTANEOUS | 1 refills | Status: DC

## 2024-01-03 ENCOUNTER — Ambulatory Visit
Admission: RE | Admit: 2024-01-03 | Discharge: 2024-01-03 | Disposition: A | Discharge status: Home / Self Care | Source: Ambulatory Visit | Attending: Internal Medicine | Admitting: Internal Medicine

## 2024-01-03 DIAGNOSIS — Z0000 Encounter for general adult medical examination without abnormal findings: Secondary | ICD-10-CM

## 2024-01-07 ENCOUNTER — Other Ambulatory Visit: Payer: Self-pay | Admitting: Internal Medicine

## 2024-01-07 DIAGNOSIS — R928 Other abnormal and inconclusive findings on diagnostic imaging of breast: Secondary | ICD-10-CM

## 2024-01-09 ENCOUNTER — Ambulatory Visit
Admission: RE | Admit: 2024-01-09 | Discharge: 2024-01-09 | Discharge status: Home / Self Care | Source: Ambulatory Visit | Attending: Internal Medicine | Admitting: Internal Medicine

## 2024-01-09 DIAGNOSIS — R928 Other abnormal and inconclusive findings on diagnostic imaging of breast: Secondary | ICD-10-CM

## 2024-01-10 ENCOUNTER — Other Ambulatory Visit: Payer: Self-pay | Admitting: Internal Medicine

## 2024-01-10 DIAGNOSIS — R928 Other abnormal and inconclusive findings on diagnostic imaging of breast: Secondary | ICD-10-CM

## 2024-01-10 DIAGNOSIS — R921 Mammographic calcification found on diagnostic imaging of breast: Secondary | ICD-10-CM

## 2024-01-15 ENCOUNTER — Encounter

## 2024-01-16 ENCOUNTER — Ambulatory Visit
Admission: RE | Admit: 2024-01-16 | Discharge: 2024-01-16 | Disposition: A | Discharge status: Home / Self Care | Source: Ambulatory Visit | Attending: Internal Medicine | Admitting: Internal Medicine

## 2024-01-16 DIAGNOSIS — R921 Mammographic calcification found on diagnostic imaging of breast: Secondary | ICD-10-CM

## 2024-01-16 DIAGNOSIS — R928 Other abnormal and inconclusive findings on diagnostic imaging of breast: Secondary | ICD-10-CM

## 2024-01-16 HISTORY — PX: BREAST BIOPSY: SHX20

## 2024-01-17 ENCOUNTER — Encounter

## 2024-01-17 LAB — SURGICAL PATHOLOGY

## 2024-01-20 ENCOUNTER — Encounter

## 2024-01-21 ENCOUNTER — Ambulatory Visit (INDEPENDENT_AMBULATORY_CARE_PROVIDER_SITE_OTHER): Admitting: Podiatry

## 2024-01-21 DIAGNOSIS — M792 Neuralgia and neuritis, unspecified: Secondary | ICD-10-CM | POA: Diagnosis not present

## 2024-01-21 NOTE — Progress Notes (Signed)
 Subjective:  Patient ID: Kristen Wise, female    DOB: 11/01/1975,  MRN: [id number]  Chief Complaint  Patient presents with   Toe Pain    Right foot 5th toe numbness and tingling sensation for about 2 weeks no injuries noted     48 y.o. female presents with the above complaint.  Patient presents with right fifth digit numbness and tingling.  Going for 2 weeks no known injury.  She went to get it evaluated make sure that there is no acute concern.  Pain scale 5 out of 10 dull aching nature she is a diabetic.  She notices more at nighttime.  She has not seen anyone else prior to seeing me for this.  She wears well-fitting shoes.   Review of Systems: Negative except as noted in the HPI. Denies N/V/F/Ch.  Past Medical History:  Diagnosis Date   Diabetes mellitus without complication (HCC)    Fibroid 08/01/2011   utrerine   Gonorrhea    H/O psoriasis    History of chicken pox    History of irregular menstrual bleeding    Hypertension     Current Outpatient Medications:    empagliflozin (JARDIANCE) 10 MG TABS tablet, Take 1 tablet by mouth daily., Disp: , Rfl:    hydrocortisone 2.5 % cream, Apply to affected areas on the nose twice daily for one week, then daily as needed., Disp: , Rfl:    lidocaine-prilocaine (EMLA) cream, Apply topically., Disp: , Rfl:    meloxicam (MOBIC) 15 MG tablet, Take 15 mg by mouth daily., Disp: , Rfl:    polyethylene glycol (MIRALAX / GLYCOLAX) 17 g packet, Take by mouth., Disp: , Rfl:    predniSONE (STERAPRED UNI-PAK 48 TAB) 10 MG (48) TBPK tablet, Take by mouth as directed., Disp: , Rfl:    triamcinolone acetonide (KENALOG-40) 40 MG/ML injection, by Intra-Lesional route., Disp: , Rfl:    triamcinolone ointment (KENALOG) 0.1 %, Apply to affected areas on legs twice daily for 2 wks, then daily for 2 wks, then every other day for 2 wks. Not to face., Disp: , Rfl:    Vitamin D, Ergocalciferol, (DRISDOL) 1.25 MG (50000 UNIT) CAPS capsule, , Disp: ,  Rfl:    acetaminophen (TYLENOL) 325 MG tablet, Take by mouth., Disp: , Rfl:    amLODipine (NORVASC) 10 MG tablet, TAKE 1 TABLET BY MOUTH DAILY, Disp: 90 tablet, Rfl: 3   atorvastatin (LIPITOR) 20 MG tablet, TAKE 1 TABLET BY MOUTH DAILY, Disp: 90 tablet, Rfl: 3   cetirizine (ZYRTEC ALLERGY) 10 MG tablet, Take 1 tablet (10 mg total) by mouth daily., Disp: 30 tablet, Rfl: 2   clobetasol ointment (TEMOVATE) 0.05 %, clobetasol 0.05 % topical ointment  APPLY TO SCALP 3 TIMES PER WEEK AS NEEDED. NOT TO FACE., Disp: , Rfl:    Continuous Glucose Sensor (DEXCOM G7 SENSOR) MISC, Use as directed to monitor blood sugars, Disp: 2 each, Rfl: 3   metFORMIN (GLUCOPHAGE) 500 MG tablet, TAKE 1 TABLET BY MOUTH TWICE  DAILY WITH A MEAL, Disp: 180 tablet, Rfl: 3   olmesartan (BENICAR) 40 MG tablet, TAKE 1 TABLET BY MOUTH DAILY, Disp: 90 tablet, Rfl: 3   omeprazole (PRILOSEC) 40 MG capsule, One capsule po qd prn (Patient not taking: Reported on 11/07/2023), Disp: 90 capsule, Rfl: 1   Semaglutide,0.25 or 0.5MG /DOS, 2 MG/3ML SOPN, Inject 0.5 mg into the skin once a week., Disp: 3 mL, Rfl: 1   spironolactone (ALDACTONE) 50 MG tablet, TAKE 1 TABLET BY MOUTH DAILY,  Disp: 90 tablet, Rfl: 3   tirzepatide (MOUNJARO) 5 MG/0.5ML Pen, Inject 5 mg into the skin once a week., Disp: 2 mL, Rfl: 0  Social History   Tobacco Use  Smoking Status Never  Smokeless Tobacco Never    Allergies  Allergen Reactions   Lisinopril Other (See Comments)   Objective:  There were no vitals filed for this visit. There is no height or weight on file to calculate BMI. Constitutional Well developed. Well nourished.  Vascular Dorsalis pedis pulses palpable bilaterally. Posterior tibial pulses palpable bilaterally. Capillary refill normal to all digits.  No cyanosis or clubbing noted. Pedal hair growth normal.  Neurologic Normal speech. Oriented to person, place, and time. Epicritic sensation to light touch grossly present bilaterally.   Dermatologic Nails well groomed and normal in appearance. No open wounds. No skin lesions.  Orthopedic: Right fifth digit mild hammertoe contracture noted with adductovarus deformity.  Some preulcerative callus noted.  No open wounds or lesion noted.  Currently stands   Radiographs: None Assessment:   1. Neuritis    Plan:  Patient was evaluated and treated and all questions answered.  Right fifth digit hammertoe contracture with underlying neuritis - All questions and concerns were discussed with the patient in extensive detail - Given the amount neuritis is present patient will benefit from shoe gear modification this was extensively discussed with the patient and she states understanding if there is no improvement we may discuss surgical options in the future.  She states understanding  No follow-ups on file.

## 2024-02-03 LAB — COMPREHENSIVE METABOLIC PANEL WITH GFR
Albumin: 4.4 (ref 3.5–5.0)
Calcium: 10 (ref 8.7–10.7)
eGFR: 91

## 2024-02-03 LAB — BASIC METABOLIC PANEL WITH GFR
BUN: 15 (ref 4–21)
CO2: 29 — AB (ref 13–22)
Chloride: 103 (ref 99–108)
Creatinine: 0.8 (ref 0.5–1.1)
Glucose: 97
Potassium: 4.3 meq/L (ref 3.5–5.1)
Sodium: 138 (ref 137–147)

## 2024-02-03 LAB — CBC AND DIFFERENTIAL
HCT: 40 (ref 36–46)
Hemoglobin: 12.9 (ref 12.0–16.0)
WBC: 7.9

## 2024-02-03 LAB — CBC: RBC: 4.7 (ref 3.87–5.11)

## 2024-03-10 ENCOUNTER — Encounter: Payer: Self-pay | Admitting: Internal Medicine

## 2024-03-10 ENCOUNTER — Ambulatory Visit (INDEPENDENT_AMBULATORY_CARE_PROVIDER_SITE_OTHER): Payer: No Typology Code available for payment source | Admitting: Internal Medicine

## 2024-03-10 VITALS — BP 110/60 | HR 86 | Temp 98.5°F | Ht 66.0 in | Wt 239.8 lb

## 2024-03-10 DIAGNOSIS — N951 Menopausal and female climacteric states: Secondary | ICD-10-CM | POA: Insufficient documentation

## 2024-03-10 DIAGNOSIS — E1169 Type 2 diabetes mellitus with other specified complication: Secondary | ICD-10-CM

## 2024-03-10 DIAGNOSIS — E041 Nontoxic single thyroid nodule: Secondary | ICD-10-CM | POA: Diagnosis not present

## 2024-03-10 DIAGNOSIS — I10 Essential (primary) hypertension: Secondary | ICD-10-CM

## 2024-03-10 DIAGNOSIS — E66812 Obesity, class 2: Secondary | ICD-10-CM

## 2024-03-10 DIAGNOSIS — Z6838 Body mass index (BMI) 38.0-38.9, adult: Secondary | ICD-10-CM

## 2024-03-10 DIAGNOSIS — E785 Hyperlipidemia, unspecified: Secondary | ICD-10-CM | POA: Diagnosis not present

## 2024-03-10 NOTE — Progress Notes (Signed)
 I,Victoria T Basil Lim, CMA,acting as a Neurosurgeon for Smiley Dung, MD.,have documented all relevant documentation on the behalf of Smiley Dung, MD,as directed by  Smiley Dung, MD while in the presence of Smiley Dung, MD.  Subjective:    Patient ID: Kristen Wise , female    DOB: Jul 24, 1976 , 48 y.o.   MRN: [id number]  Chief Complaint  Patient presents with   Diabetes    Patient presents today for bp & dm follow up. She reports compliance with medications. Denies headache, chest pain & sob.    Hypertension    HPI Discussed the use of AI scribe software for clinical note transcription with the patient, who gave verbal consent to proceed.  History of Present Illness Kristen Lair Buchanan "Burdette Carolin" is a 48 year old female with diabetes and hypertension who presents for a routine check-up and medication management.  She has not been checking her blood sugars regularly but recalls her last readings were around 145-148 mg/dL. She is currently taking Ozempic  0.5 mg and metformin  twice a day for her diabetes.  Since her last visit, she has established care with Dr. Vick Gram at [facility]. She did not state the reason for the referral. No DM meds were changed. There was no discussion of optimizing the dose of Ozempic .   For hypertension, she is taking amlodipine , spironolactone , and olmesartan . Her blood pressure was recorded as 108/62 mmHg during a recent visit to an endocrinologist. She has not experienced dizziness. She has a home blood pressure cuff.  Endo suggested that she stop the amlodipine  (not titrate the dose).   She has a history of thyroid  nodules discovered incidentally during a carotid ultrasound for ear ringing. She underwent a thyroid  ultrasound in January. She has been seeing an ENT specialist, Dr. Silvestre Drum, for her ear ringing, which remains unexplained after an MRA showed no vascular issues. Her hearing test was normal.  She has been experiencing weight  management issues and is considering optimizing her Ozempic  dose. She previously attended [facility] for weight management but was dissatisfied with the care.  She is experiencing hot flashes post-hysterectomy and is unsure if she needs regular GYN visits. She would like referral to GYN.   She exercises four to five days a week, walking for thirty to forty-five minutes after work.   Diabetes She presents for her follow-up diabetic visit. She has type 2 diabetes mellitus. Her disease course has been stable. There are no hypoglycemic associated symptoms. Pertinent negatives for diabetes include no blurred vision. There are no hypoglycemic complications. Risk factors for coronary artery disease include diabetes mellitus, dyslipidemia, obesity, hypertension and sedentary lifestyle. She is following a diabetic diet. She participates in exercise intermittently. Her breakfast blood glucose is taken between 9-10 am. Her breakfast blood glucose range is generally 110-130 mg/dl.  Hypertension This is a chronic problem. The current episode started more than 1 year ago. The problem has been gradually improving since onset. Pertinent negatives include no blurred vision. Past treatments include calcium  channel blockers, angiotensin blockers and diuretics.     Past Medical History:  Diagnosis Date   Diabetes mellitus without complication (HCC)    Fibroid 08/01/2011   utrerine   Gonorrhea    H/O psoriasis    History of chicken pox    History of irregular menstrual bleeding    Hypertension      Family History  Problem Relation Age of Onset   Diabetes Maternal Grandmother    Breast cancer Maternal  Aunt    Breast cancer Cousin 40   Hypertension Mother    Diabetes Father    Hyperlipidemia Father    Seizures Neg Hx      Current Outpatient Medications:    acetaminophen (TYLENOL) 325 MG tablet, Take by mouth., Disp: , Rfl:    amLODipine  (NORVASC ) 10 MG tablet, TAKE 1 TABLET BY MOUTH DAILY, Disp: 90  tablet, Rfl: 3   atorvastatin  (LIPITOR) 20 MG tablet, TAKE 1 TABLET BY MOUTH DAILY, Disp: 90 tablet, Rfl: 3   clobetasol ointment (TEMOVATE) 0.05 %, clobetasol 0.05 % topical ointment  APPLY TO SCALP 3 TIMES PER WEEK AS NEEDED. NOT TO FACE., Disp: , Rfl:    Continuous Glucose Sensor (DEXCOM G7 SENSOR) MISC, Use as directed to monitor blood sugars, Disp: 2 each, Rfl: 3   hydrocortisone 2.5 % cream, Apply to affected areas on the nose twice daily for one week, then daily as needed., Disp: , Rfl:    lidocaine-prilocaine (EMLA) cream, Apply topically., Disp: , Rfl:    meloxicam  (MOBIC ) 15 MG tablet, Take 15 mg by mouth daily., Disp: , Rfl:    metFORMIN  (GLUCOPHAGE ) 500 MG tablet, TAKE 1 TABLET BY MOUTH TWICE  DAILY WITH A MEAL, Disp: 180 tablet, Rfl: 3   olmesartan  (BENICAR ) 40 MG tablet, TAKE 1 TABLET BY MOUTH DAILY, Disp: 90 tablet, Rfl: 3   polyethylene glycol (MIRALAX / GLYCOLAX) 17 g packet, Take by mouth., Disp: , Rfl:    Semaglutide ,0.25 or 0.5MG /DOS, 2 MG/3ML SOPN, Inject 0.5 mg into the skin once a week., Disp: 3 mL, Rfl: 1   spironolactone  (ALDACTONE ) 50 MG tablet, TAKE 1 TABLET BY MOUTH DAILY, Disp: 90 tablet, Rfl: 3   triamcinolone ointment (KENALOG) 0.1 %, Apply to affected areas on legs twice daily for 2 wks, then daily for 2 wks, then every other day for 2 wks. Not to face., Disp: , Rfl:    Vitamin D , Ergocalciferol , (DRISDOL ) 1.25 MG (50000 UNIT) CAPS capsule, , Disp: , Rfl:    cetirizine  (ZYRTEC  ALLERGY) 10 MG tablet, Take 1 tablet (10 mg total) by mouth daily., Disp: 30 tablet, Rfl: 2   empagliflozin  (JARDIANCE ) 10 MG TABS tablet, Take 1 tablet by mouth daily. (Patient not taking: Reported on 03/10/2024), Disp: , Rfl:    omeprazole  (PRILOSEC) 40 MG capsule, One capsule po qd prn (Patient not taking: Reported on 11/07/2023), Disp: 90 capsule, Rfl: 1   triamcinolone acetonide (KENALOG-40) 40 MG/ML injection, by Intra-Lesional route., Disp: , Rfl:    Allergies  Allergen Reactions    Lisinopril Other (See Comments)      The patient states she uses status post hysterectomy for birth control. Patient's last menstrual period was 07/24/2018.. Negative for Dysmenorrhea. Negative for: breast discharge, breast lump(s), breast pain and breast self exam. Associated symptoms include abnormal vaginal bleeding. Pertinent negatives include abnormal bleeding (hematology), anxiety, decreased libido, depression, difficulty falling sleep, dyspareunia, history of infertility, nocturia, sexual dysfunction, sleep disturbances, urinary incontinence, urinary urgency, vaginal discharge and vaginal itching. Diet regular.The patient states her exercise level is    . The patient's tobacco use is:  Social History   Tobacco Use  Smoking Status Never  Smokeless Tobacco Never  . She has been exposed to passive smoke. The patient's alcohol use is:  Social History   Substance and Sexual Activity  Alcohol Use No  ..    Review of Systems  Constitutional: Negative.   HENT: Negative.    Eyes: Negative.  Negative for blurred vision.  Respiratory: Negative.    Cardiovascular: Negative.   Gastrointestinal: Negative.   Skin: Negative.      Today's Vitals   03/10/24 1601  BP: 110/60  Pulse: 86  Temp: 98.5 F (36.9 C)  SpO2: 98%  Weight: 239 lb 12.8 oz (108.8 kg)  Height: 5\' 6"  (1.676 m)   Body mass index is 38.7 kg/m.  Wt Readings from Last 3 Encounters:  03/10/24 239 lb 12.8 oz (108.8 kg)  11/07/23 236 lb (107 kg)  06/27/23 229 lb 9.6 oz (104.1 kg)    BP Readings from Last 3 Encounters:  03/10/24 110/60  11/07/23 108/80  06/27/23 108/80     Objective:  Physical Exam Vitals and nursing note reviewed.  Constitutional:      Appearance: Normal appearance. She is obese.  HENT:     Head: Normocephalic and atraumatic.  Eyes:     Extraocular Movements: Extraocular movements intact.  Cardiovascular:     Rate and Rhythm: Normal rate and regular rhythm.     Heart sounds: Normal heart  sounds.  Pulmonary:     Effort: Pulmonary effort is normal.     Breath sounds: Normal breath sounds.  Musculoskeletal:     Cervical back: Normal range of motion.  Skin:    General: Skin is warm.  Neurological:     General: No focal deficit present.     Mental Status: She is alert.  Psychiatric:        Mood and Affect: Mood normal.        Behavior: Behavior normal.       Assessment And Plan:     Dyslipidemia due to type 2 diabetes mellitus (HCC) Assessment & Plan: Chronic, LDL goal is less than 70.  Type 2 diabetes managed with metformin  and Ozempic . Blood sugar levels stable. Emphasized strength training and protein intake to prevent malnutrition with GLP-1 agonists. - Refill Ozempic  0.5 mg. - Incorporate strength training twice a week. - Ensure adequate protein intake, aiming for 100 grams per day. - Further DM management as per Endo, encouraged to keep upcoming appt - Will abstract recent labs into Mychart   Essential hypertension, benign Assessment & Plan: Chronic, well controlled.  Hypertension managed with amlodipine , spironolactone , and olmesartan . Blood pressure low normal but asymptomatic. Discussed potential amlodipine  dose adjustment if low blood pressure persists. - Continue current antihypertensive regimen. - Monitor blood pressure at home and report readings. - Consider reducing amlodipine  dose if blood pressure remains low, using a pill cutter.   Right thyroid  nodule Assessment & Plan: Thyroid  nodule stable on recent evaluations. Annual ultrasound follow-up recommended. - Schedule follow-up thyroid  ultrasound in January or February next year.    Symptomatic menopausal or female climacteric states -     Ambulatory referral to Obstetrics / Gynecology  Class 2 severe obesity due to excess calories with serious comorbidity and body mass index (BMI) of 38.0 to 38.9 in adult [facility]) Assessment & Plan: She is no longer going to [place]. She is encouraged to  incorporate more strength training into her workout routine. Advised to aim for at least 150 minutes of exercise per week.   Orders: -     Amb Ref to Medical Weight Management   Return if symptoms worsen or fail to improve. Patient was given opportunity to ask questions. Patient verbalized understanding of the plan and was able to repeat key elements of the plan. All questions were answered to their satisfaction.   I, Smiley Dung, MD, have reviewed all documentation  for this visit. The documentation on 03/10/24 for the exam, diagnosis, procedures, and orders are all accurate and complete.

## 2024-03-10 NOTE — Assessment & Plan Note (Signed)
 Thyroid  nodule stable on recent evaluations. Annual ultrasound follow-up recommended. - Schedule follow-up thyroid  ultrasound in January or February next year.

## 2024-03-10 NOTE — Assessment & Plan Note (Signed)
 Chronic, LDL goal is less than 70.  Type 2 diabetes managed with metformin  and Ozempic . Blood sugar levels stable. Emphasized strength training and protein intake to prevent malnutrition with GLP-1 agonists. - Refill Ozempic  0.5 mg. - Incorporate strength training twice a week. - Ensure adequate protein intake, aiming for 100 grams per day. - Further DM management as per Endo, encouraged to keep upcoming appt - Will abstract recent labs into Mychart

## 2024-03-10 NOTE — Assessment & Plan Note (Signed)
 She is no longer going to [employer]. She is encouraged to incorporate more strength training into her workout routine. Advised to aim for at least 150 minutes of exercise per week.

## 2024-03-10 NOTE — Assessment & Plan Note (Signed)
 Chronic, well controlled.  Hypertension managed with amlodipine , spironolactone , and olmesartan . Blood pressure low normal but asymptomatic. Discussed potential amlodipine  dose adjustment if low blood pressure persists. - Continue current antihypertensive regimen. - Monitor blood pressure at home and report readings. - Consider reducing amlodipine  dose if blood pressure remains low, using a pill cutter.

## 2024-04-13 ENCOUNTER — Encounter (INDEPENDENT_AMBULATORY_CARE_PROVIDER_SITE_OTHER): Payer: Self-pay

## 2024-04-27 ENCOUNTER — Other Ambulatory Visit: Payer: Self-pay | Admitting: Internal Medicine

## 2024-04-27 DIAGNOSIS — E785 Hyperlipidemia, unspecified: Secondary | ICD-10-CM

## 2024-04-28 ENCOUNTER — Encounter: Payer: Self-pay | Admitting: Obstetrics and Gynecology

## 2024-04-28 ENCOUNTER — Ambulatory Visit: Admitting: Obstetrics and Gynecology

## 2024-04-28 VITALS — BP 120/62 | HR 96 | Temp 97.9°F | Ht 67.0 in | Wt 236.0 lb

## 2024-04-28 DIAGNOSIS — N951 Menopausal and female climacteric states: Secondary | ICD-10-CM

## 2024-04-28 MED ORDER — ESTRADIOL 0.05 MG/24HR TD PTTW: 1.0000 | MEDICATED_PATCH | TRANSDERMAL | 3 refills | Status: DC

## 2024-04-28 NOTE — Assessment & Plan Note (Addendum)
 Briefly discussed management of VMS, including use of HRT, SSRI, and veozah. Discussed necessity of risk screening depending on class of treatment.  Patient is interested in HRT. Risk and benefits reviewed. Reviewed risk of breast cancer, DVT, and cardiovascular risk including stroke and MI. 63yr CV risk: 3.16%.  Breast cancer risk: 1.2% and 9.2%, Gail model 5-year and lifetime risk respectively. She is an appropriate candidate as she is perimenopausal or it has been less than 10 years since menopause. Discussed topical estrogen carries a lower risk of DVT and will prescribe progestin for endometrial protection unless patient had had a hysterectomy. All questions answered. RTO in 3 months.

## 2024-04-28 NOTE — Progress Notes (Signed)
 48 y.o. [id number] female s/p RATLH, BS ([facility] 2019, fibroids, endometriosis) here for referral for symptomatic menopause. Married.  Patient's last menstrual period was 07/24/2018.   She reports menopause symptoms - achy joints, hot flashes, body odor, mood swings, hard to loose weight with gym excersises. Symptoms has been within the past year. Low libido.  Velton Paris Harvest MD in Aug 2024 with low normal estrogen and testosterone : FSH 10, Estrogen 22, Test 15 Lipid 136, HDL 66, LDL 60 (last panel) Prescribed topical testosterone  and stopped taking after some time  Urine sample provided: Yes  Birth control: None Last mammogram: 01/09/24 BiR-Rads 4>fibroadenoma Sexually active: Yes   Exercise: strength training and cardio, 4-5d/wk No hx of DVT or smoking.  GYN HISTORY: No sig hx  OB History  Gravida Para Term Preterm AB Living  1    1   SAB IAB Ectopic Multiple Live Births   1       # Outcome Date GA Lbr Len/2nd Weight Sex Type Anes PTL Lv  1 IAB            Past Medical History:  Diagnosis Date   Diabetes mellitus without complication (HCC)    Fibroid 08/01/2011   utrerine   H/O psoriasis    History of chicken pox    History of irregular menstrual bleeding    Hypertension    Multiple thyroid  nodules 10/2023   Past Surgical History:  Procedure Laterality Date   ABDOMINAL HYSTERECTOMY  10/31/2017   BREAST BIOPSY Right    BREAST BIOPSY Right 01/16/2024   MM RT BREAST BX W LOC DEV 1ST LESION IMAGE BX SPEC STEREO GUIDE 01/16/2024 [facility]   BREAST BIOPSY Right 01/16/2024   MM RT BREAST BX W LOC DEV EA AD LESION IMG BX SPEC STEREO GUIDE 01/16/2024 [facility]   DILATION AND CURETTAGE OF UTERUS     Current Outpatient Medications on File Prior to Visit  Medication Sig Dispense Refill   acetaminophen (TYLENOL) 325 MG tablet Take by mouth.     amLODipine  (NORVASC ) 10 MG tablet TAKE 1 TABLET BY MOUTH DAILY 90 tablet 3   atorvastatin  (LIPITOR) 20 MG tablet TAKE 1 TABLET  BY MOUTH DAILY 90 tablet 3   cetirizine  (ZYRTEC  ALLERGY) 10 MG tablet Take 1 tablet (10 mg total) by mouth daily. 30 tablet 2   clobetasol ointment (TEMOVATE) 0.05 % clobetasol 0.05 % topical ointment  APPLY TO SCALP 3 TIMES PER WEEK AS NEEDED. NOT TO FACE.     Continuous Glucose Sensor (DEXCOM G7 SENSOR) MISC Use as directed to monitor blood sugars 2 each 3   hydrocortisone 2.5 % cream Apply to affected areas on the nose twice daily for one week, then daily as needed.     lidocaine-prilocaine (EMLA) cream Apply topically.     meloxicam  (MOBIC ) 15 MG tablet Take 15 mg by mouth daily.     metFORMIN  (GLUCOPHAGE ) 500 MG tablet TAKE 1 TABLET BY MOUTH TWICE  DAILY WITH A MEAL 180 tablet 3   olmesartan  (BENICAR ) 40 MG tablet TAKE 1 TABLET BY MOUTH DAILY 90 tablet 3   omeprazole  (PRILOSEC) 40 MG capsule One capsule po qd prn 90 capsule 1   OZEMPIC , 0.25 OR 0.5 MG/DOSE, 2 MG/3ML SOPN INJECT 0.5 MG UNDER THE SKIN ONCE A WEEK 3 mL 1   polyethylene glycol (MIRALAX / GLYCOLAX) 17 g packet Take by mouth.     spironolactone  (ALDACTONE ) 50 MG tablet TAKE 1 TABLET BY MOUTH DAILY 90 tablet  3   triamcinolone acetonide (KENALOG-40) 40 MG/ML injection by Intra-Lesional route.     triamcinolone ointment (KENALOG) 0.1 % Apply to affected areas on legs twice daily for 2 wks, then daily for 2 wks, then every other day for 2 wks. Not to face.     Vitamin D , Ergocalciferol , (DRISDOL ) 1.25 MG (50000 UNIT) CAPS capsule      No current facility-administered medications on file prior to visit.   Allergies  Allergen Reactions   Lisinopril Other (See Comments)      PE Today's Vitals   04/28/24 1458  BP: 120/62  Pulse: 96  Temp: 97.9 F (36.6 C)  TempSrc: Oral  Weight: 236 lb (107 kg)  Height: 5' 7 (1.702 m)   Body mass index is 36.96 kg/m.  Physical Exam Vitals reviewed.  Constitutional:      General: She is not in acute distress.    Appearance: Normal appearance.  HENT:     Head: Normocephalic and  atraumatic.     Nose: Nose normal.  Eyes:     Extraocular Movements: Extraocular movements intact.     Conjunctiva/sclera: Conjunctivae normal.  Pulmonary:     Effort: Pulmonary effort is normal.  Musculoskeletal:        General: Normal range of motion.     Cervical back: Normal range of motion.  Neurological:     General: No focal deficit present.     Mental Status: She is alert.  Psychiatric:        Mood and Affect: Mood normal.        Behavior: Behavior normal.       Assessment and Plan:        Perimenopausal symptoms Assessment & Plan: Briefly discussed management of VMS, including use of HRT, SSRI, and veozah. Discussed necessity of risk screening depending on class of treatment.  Patient is interested in HRT. Risk and benefits reviewed. Reviewed risk of breast cancer, DVT, and cardiovascular risk including stroke and MI. 54yr CV risk: 3.16%.  Breast cancer risk: 1.2% and 9.2%, Gail model 5-year and lifetime risk respectively. She is an appropriate candidate as she is perimenopausal or it has been less than 10 years since menopause. Discussed topical estrogen carries a lower risk of DVT and will prescribe progestin for endometrial protection unless patient had had a hysterectomy. All questions answered. RTO in 3 months.   Orders: -     Estradiol ; Place 1 patch (0.05 mg total) onto the skin 2 (two) times a week.  Dispense: 24 patch; Refill: 3    Vera LULLA Pa, MD

## 2024-06-01 ENCOUNTER — Other Ambulatory Visit: Payer: Self-pay | Admitting: Internal Medicine

## 2024-06-01 DIAGNOSIS — E042 Nontoxic multinodular goiter: Secondary | ICD-10-CM

## 2024-06-01 LAB — LIPID PANEL
Cholesterol: 143 (ref 0–200)
HDL: 73 — AB (ref 35–70)
LDL Cholesterol: 56

## 2024-06-01 LAB — LAB REPORT - SCANNED
A1c: 7
EGFR: 97
Free T4: 0.68 ng/dL
TSH: 1.42

## 2024-06-29 ENCOUNTER — Ambulatory Visit (INDEPENDENT_AMBULATORY_CARE_PROVIDER_SITE_OTHER): Payer: No Typology Code available for payment source | Admitting: Internal Medicine

## 2024-06-29 ENCOUNTER — Encounter: Payer: Self-pay | Admitting: Internal Medicine

## 2024-06-29 ENCOUNTER — Ambulatory Visit: Payer: Self-pay | Admitting: Internal Medicine

## 2024-06-29 VITALS — BP 110/80 | HR 81 | Temp 98.1°F | Ht 67.0 in | Wt 244.4 lb

## 2024-06-29 DIAGNOSIS — E785 Hyperlipidemia, unspecified: Secondary | ICD-10-CM

## 2024-06-29 DIAGNOSIS — R3129 Other microscopic hematuria: Secondary | ICD-10-CM

## 2024-06-29 DIAGNOSIS — E1169 Type 2 diabetes mellitus with other specified complication: Secondary | ICD-10-CM | POA: Diagnosis not present

## 2024-06-29 DIAGNOSIS — R21 Rash and other nonspecific skin eruption: Secondary | ICD-10-CM | POA: Insufficient documentation

## 2024-06-29 DIAGNOSIS — Z0000 Encounter for general adult medical examination without abnormal findings: Secondary | ICD-10-CM | POA: Diagnosis not present

## 2024-06-29 DIAGNOSIS — Z6838 Body mass index (BMI) 38.0-38.9, adult: Secondary | ICD-10-CM

## 2024-06-29 DIAGNOSIS — E66812 Obesity, class 2: Secondary | ICD-10-CM

## 2024-06-29 DIAGNOSIS — I10 Essential (primary) hypertension: Secondary | ICD-10-CM | POA: Diagnosis not present

## 2024-06-29 DIAGNOSIS — E041 Nontoxic single thyroid nodule: Secondary | ICD-10-CM | POA: Diagnosis not present

## 2024-06-29 LAB — POCT URINALYSIS DIP (CLINITEK)
Bilirubin, UA: NEGATIVE
Glucose, UA: NEGATIVE mg/dL
Ketones, POC UA: NEGATIVE mg/dL
Leukocytes, UA: NEGATIVE
Nitrite, UA: NEGATIVE
POC PROTEIN,UA: NEGATIVE
Spec Grav, UA: 1.025 (ref 1.010–1.025)
Urobilinogen, UA: 0.2 U/dL
pH, UA: 5.5 (ref 5.0–8.0)

## 2024-06-29 MED ORDER — SPIRONOLACTONE 50 MG PO TABS: 50.0000 mg | ORAL_TABLET | Freq: Every day | ORAL | 3 refills | Status: DC

## 2024-06-29 MED ORDER — OLMESARTAN MEDOXOMIL 40 MG PO TABS: 40.0000 mg | ORAL_TABLET | Freq: Every day | ORAL | 3 refills | Status: DC

## 2024-06-29 MED ORDER — ATORVASTATIN CALCIUM 20 MG PO TABS: 20.0000 mg | ORAL_TABLET | Freq: Every day | ORAL | 3 refills | Status: AC

## 2024-06-29 NOTE — Assessment & Plan Note (Signed)

## 2024-06-29 NOTE — Patient Instructions (Signed)

## 2024-06-29 NOTE — Assessment & Plan Note (Addendum)
 Chronic, diabetic foot exam was performed. She is now followed by Endocrinology.  She will continue with metformin  and Ozempic  per Endo. August 2025 A1c 7%.  - Will see her every 6 months since she is now followed by Endo - I DISCUSSED WITH THE PATIENT AT LENGTH REGARDING THE GOALS OF GLYCEMIC CONTROL AND POSSIBLE LONG-TERM COMPLICATIONS.  I  ALSO STRESSED THE IMPORTANCE OF COMPLIANCE WITH HOME GLUCOSE MONITORING, DIETARY RESTRICTIONS INCLUDING AVOIDANCE OF SUGARY DRINKS/PROCESSED FOODS,  ALONG WITH REGULAR EXERCISE.  I  ALSO STRESSED THE IMPORTANCE OF ANNUAL EYE EXAMS, SELF FOOT CARE AND COMPLIANCE WITH OFFICE VISITS.

## 2024-06-29 NOTE — Assessment & Plan Note (Signed)
 Chronic, controlled. EKG performed, NSR w/o acute changes.  She states Dr. Braulio recommended she stop amlodipine  due to normal BP readings. I advised patient to decrease to 5mg  daily.  - Follow low sodium diet - Follow up in six months

## 2024-06-29 NOTE — Assessment & Plan Note (Signed)
 She is encouraged to strive for BMI less than 30 to decrease cardiac risk. Advised to aim for at least 150 minutes of exercise per week.

## 2024-06-29 NOTE — Progress Notes (Signed)
 I,Victoria T Emmitt, CMA,acting as a Neurosurgeon for Catheryn LOISE Slocumb, MD.,have documented all relevant documentation on the behalf of Catheryn LOISE Slocumb, MD,as directed by  Catheryn LOISE Slocumb, MD while in the presence of Catheryn LOISE Slocumb, MD.  Subjective:    Patient ID: Kristen Wise , female    DOB: 03-10-1976 , 48 y.o.   MRN: [id number]  Chief Complaint  Patient presents with   Annual Exam    Patient presents today for annual exam. She reports compliance with medications. Denies headache, chest pain & sob. GYN: Dr Dallie.   Diabetes   Hypertension    HPI Discussed the use of AI scribe software for clinical note transcription with the patient, who gave verbal consent to proceed.  History of Present Illness Kristen Wise is a 48 year old female with diabetes and hypertension who presents for a physical and diabetes check.  She has been engaging in strength training exercises three to four days a week since late May or June but feels it is not yielding the desired results. She consumes water, though not as much as recommended.  Her current medications include amlodipine  10 mg, atorvastatin  20 mg, metformin  twice daily, olmesartan  40 mg, and an estradiol  patch. She uses Zyrtec  as needed, approximately one week per year. She is on Ozempic  0.5 mg and recalls stopping the 1 mg dose, which she tolerated well. Despite her efforts, her A1c levels seemed to be increasing as noted in her recent visit in August.  She is now followed by Dr. Braulio at [facility] for DM management.  She has a history of wearing glasses since age 16 and has an eye exam scheduled for November. She tries to perform breast self-exams when in the shower and reports an increase in bowel movement frequency, attributing it to Ozempic , which previously caused constipation.  She is using an over-the-counter antifungal cream and Gold Bond powder for itchy skin patches, which have shown improvement. She is under the care  of a dermatologist but finds it challenging to secure appointments due to the dermatologist's busy schedule.   Diabetes She presents for her follow-up diabetic visit. She has type 2 diabetes mellitus. Her disease course has been stable. There are no hypoglycemic associated symptoms. Pertinent negatives for diabetes include no blurred vision. There are no hypoglycemic complications. Risk factors for coronary artery disease include diabetes mellitus, dyslipidemia, obesity, hypertension and sedentary lifestyle. She is following a diabetic diet. She participates in exercise intermittently. Her breakfast blood glucose is taken between 9-10 am. Her breakfast blood glucose range is generally 110-130 mg/dl.  Hypertension This is a chronic problem. The current episode started more than 1 year ago. The problem has been gradually improving since onset. Pertinent negatives include no blurred vision.     Past Medical History:  Diagnosis Date   Diabetes mellitus without complication (HCC)    Fibroid 08/01/2011   utrerine   H/O psoriasis    History of chicken pox    History of irregular menstrual bleeding    Hypertension    Multiple thyroid  nodules 10/2023     Family History  Problem Relation Age of Onset   Diabetes Maternal Grandmother    Breast cancer Maternal Aunt    Breast cancer Cousin 40   Hypertension Mother    Diabetes Father    Hyperlipidemia Father    Seizures Neg Hx      Current Outpatient Medications:    acetaminophen (TYLENOL) 325 MG tablet, Take by mouth., Disp: ,  Rfl:    amLODipine  (NORVASC ) 10 MG tablet, TAKE 1 TABLET BY MOUTH DAILY, Disp: 90 tablet, Rfl: 3   cetirizine  (ZYRTEC  ALLERGY) 10 MG tablet, Take 1 tablet (10 mg total) by mouth daily., Disp: 30 tablet, Rfl: 2   clobetasol ointment (TEMOVATE) 0.05 %, clobetasol 0.05 % topical ointment  APPLY TO SCALP 3 TIMES PER WEEK AS NEEDED. NOT TO FACE., Disp: , Rfl:    Continuous Glucose Sensor (DEXCOM G7 SENSOR) MISC, Use as  directed to monitor blood sugars, Disp: 2 each, Rfl: 3   estradiol  (VIVELLE -DOT) 0.05 MG/24HR patch, Place 1 patch (0.05 mg total) onto the skin 2 (two) times a week., Disp: 24 patch, Rfl: 3   hydrocortisone 2.5 % cream, Apply to affected areas on the nose twice daily for one week, then daily as needed., Disp: , Rfl:    lidocaine-prilocaine (EMLA) cream, Apply topically., Disp: , Rfl:    meloxicam  (MOBIC ) 15 MG tablet, Take 15 mg by mouth daily., Disp: , Rfl:    metFORMIN  (GLUCOPHAGE ) 500 MG tablet, TAKE 1 TABLET BY MOUTH TWICE  DAILY WITH A MEAL, Disp: 180 tablet, Rfl: 3   omeprazole  (PRILOSEC) 40 MG capsule, One capsule po qd prn, Disp: 90 capsule, Rfl: 1   OZEMPIC , 0.25 OR 0.5 MG/DOSE, 2 MG/3ML SOPN, INJECT 0.5 MG UNDER THE SKIN ONCE A WEEK, Disp: 3 mL, Rfl: 1   polyethylene glycol (MIRALAX / GLYCOLAX) 17 g packet, Take by mouth., Disp: , Rfl:    triamcinolone acetonide (KENALOG-40) 40 MG/ML injection, by Intra-Lesional route., Disp: , Rfl:    triamcinolone ointment (KENALOG) 0.1 %, Apply to affected areas on legs twice daily for 2 wks, then daily for 2 wks, then every other day for 2 wks. Not to face., Disp: , Rfl:    Vitamin D , Ergocalciferol , (DRISDOL ) 1.25 MG (50000 UNIT) CAPS capsule, , Disp: , Rfl:    atorvastatin  (LIPITOR) 20 MG tablet, Take 1 tablet (20 mg total) by mouth daily., Disp: 90 tablet, Rfl: 3   olmesartan  (BENICAR ) 40 MG tablet, Take 1 tablet (40 mg total) by mouth daily., Disp: 90 tablet, Rfl: 3   spironolactone  (ALDACTONE ) 50 MG tablet, Take 1 tablet (50 mg total) by mouth daily., Disp: 90 tablet, Rfl: 3   Allergies  Allergen Reactions   Lisinopril Other (See Comments)      The patient states she uses none for birth control. Patient's last menstrual period was 07/24/2018.. Negative for Dysmenorrhea. Negative for: breast discharge, breast lump(s), breast pain and breast self exam. Associated symptoms include abnormal vaginal bleeding. Pertinent negatives include abnormal  bleeding (hematology), anxiety, decreased libido, depression, difficulty falling sleep, dyspareunia, history of infertility, nocturia, sexual dysfunction, sleep disturbances, urinary incontinence, urinary urgency, vaginal discharge and vaginal itching. Diet regular.The patient states her exercise level is    . The patient's tobacco use is:  Social History   Tobacco Use  Smoking Status Never  Smokeless Tobacco Never  . She has been exposed to passive smoke. The patient's alcohol use is:  Social History   Substance and Sexual Activity  Alcohol Use No   Review of Systems  Constitutional: Negative.   HENT: Negative.    Eyes: Negative.  Negative for blurred vision.  Respiratory: Negative.    Cardiovascular: Negative.   Gastrointestinal: Negative.   Endocrine: Negative.   Genitourinary: Negative.   Musculoskeletal: Negative.   Skin: Negative.   Allergic/Immunologic: Negative.   Neurological: Negative.   Hematological: Negative.   Psychiatric/Behavioral: Negative.  Today's Vitals   06/29/24 0916  BP: 110/80  Pulse: 81  Temp: 98.1 F (36.7 C)  SpO2: 98%  Weight: 244 lb 6.4 oz (110.9 kg)  Height: 5' 7 (1.702 m)   Body mass index is 38.28 kg/m.  Wt Readings from Last 3 Encounters:  06/29/24 244 lb 6.4 oz (110.9 kg)  04/28/24 236 lb (107 kg)  03/10/24 239 lb 12.8 oz (108.8 kg)     Objective:  Physical Exam Vitals and nursing note reviewed.  Constitutional:      Appearance: Normal appearance. She is obese.  HENT:     Head: Normocephalic and atraumatic.     Right Ear: Tympanic membrane, ear canal and external ear normal.     Left Ear: Tympanic membrane, ear canal and external ear normal.     Nose:     Comments: Masked     Mouth/Throat:     Comments: Masked  Eyes:     Extraocular Movements: Extraocular movements intact.     Conjunctiva/sclera: Conjunctivae normal.     Pupils: Pupils are equal, round, and reactive to light.  Cardiovascular:     Rate and  Rhythm: Normal rate and regular rhythm.     Pulses: Normal pulses.          Dorsalis pedis pulses are 2+ on the right side and 2+ on the left side.     Heart sounds: Normal heart sounds.  Pulmonary:     Effort: Pulmonary effort is normal.     Breath sounds: Normal breath sounds.  Chest:  Breasts:    Tanner Score is 5.     Right: Normal.     Left: Normal.     Comments: Keloid scarring on b/l breasts Abdominal:     General: Abdomen is flat. Bowel sounds are normal.     Palpations: Abdomen is soft.  Genitourinary:    Comments: deferred Musculoskeletal:        General: Normal range of motion.     Cervical back: Normal range of motion and neck supple.  Feet:     Right foot:     Protective Sensation: 5 sites tested.  5 sites sensed.     Skin integrity: Dry skin present.     Toenail Condition: Right toenails are normal.     Left foot:     Protective Sensation: 5 sites tested.  5 sites sensed.     Skin integrity: Dry skin present.     Toenail Condition: Left toenails are normal.  Skin:    General: Skin is warm and dry.     Findings: Rash present.     Comments: Keloid on anterior chest Scaly rash on upper abdomen  Neurological:     General: No focal deficit present.     Mental Status: She is alert and oriented to person, place, and time.  Psychiatric:        Mood and Affect: Mood normal.        Behavior: Behavior normal.         Assessment And Plan:     Annual physical exam Assessment & Plan: A full exam was performed.  Importance of monthly self breast exams was discussed with the patient.  She is advised to get 30-45 minutes of regular exercise, no less than four to five days per week. Both weight-bearing and aerobic exercises are recommended.  She is advised to follow a healthy diet with at least six fruits/veggies per day, decrease intake of red meat and other  saturated fats and to increase fish intake to twice weekly.  Meats/fish should not be fried -- baked, boiled or  broiled is preferable. It is also important to cut back on your sugar intake.  Be sure to read labels - try to avoid anything with added sugar, high fructose corn syrup or other sweeteners.  If you must use a sweetener, you can try stevia or monkfruit.  It is also important to avoid artificially sweetened foods/beverages and diet drinks. Lastly, wear SPF 50 sunscreen on exposed skin and when in direct sunlight for an extended period of time.  Be sure to avoid fast food restaurants and aim for at least 60 ounces of water daily.      Orders: -     CBC -     CMP14+EGFR  Dyslipidemia due to type 2 diabetes mellitus (HCC) Assessment & Plan: Chronic, diabetic foot exam was performed. She is now followed by Endocrinology.  She will continue with metformin  and Ozempic  per Endo. August 2025 A1c 7%.  - Will see her every 6 months since she is now followed by Endo - I DISCUSSED WITH THE PATIENT AT LENGTH REGARDING THE GOALS OF GLYCEMIC CONTROL AND POSSIBLE LONG-TERM COMPLICATIONS.  I  ALSO STRESSED THE IMPORTANCE OF COMPLIANCE WITH HOME GLUCOSE MONITORING, DIETARY RESTRICTIONS INCLUDING AVOIDANCE OF SUGARY DRINKS/PROCESSED FOODS,  ALONG WITH REGULAR EXERCISE.  I  ALSO STRESSED THE IMPORTANCE OF ANNUAL EYE EXAMS, SELF FOOT CARE AND COMPLIANCE WITH OFFICE VISITS.   Orders: -     POCT URINALYSIS DIP (CLINITEK) -     Microalbumin / creatinine urine ratio -     EKG 12-Lead -     Atorvastatin  Calcium ; Take 1 tablet (20 mg total) by mouth daily.  Dispense: 90 tablet; Refill: 3  Essential hypertension, benign Assessment & Plan: Chronic, controlled. EKG performed, NSR w/o acute changes.  She states Dr. Braulio recommended she stop amlodipine  due to normal BP readings. I advised patient to decrease to 5mg  daily.  - Follow low sodium diet - Follow up in six months  Orders: -     POCT URINALYSIS DIP (CLINITEK) -     Microalbumin / creatinine urine ratio -     EKG 12-Lead -     Olmesartan  Medoxomil; Take 1  tablet (40 mg total) by mouth daily.  Dispense: 90 tablet; Refill: 3 -     Spironolactone ; Take 1 tablet (50 mg total) by mouth daily.  Dispense: 90 tablet; Refill: 3  Right thyroid  nodule Assessment & Plan: Thyroid  nodule stable on recent evaluations. Annual ultrasound follow-up recommended. - Schedule follow-up thyroid  ultrasound in February 2026 - Thyroid  labs checked by [facility]    Scaly patch rash Assessment & Plan: Her symptoms improved with OTC antifungal cream and Gold Bond powder.  Exam suggestive of psoriasis. I will refer her to Derm for further evaluation.   Orders: -     Ambulatory referral to Dermatology  Class 2 severe obesity due to excess calories with serious comorbidity and body mass index (BMI) of 38.0 to 38.9 in adult [facility]) Assessment & Plan: She is encouraged to strive for BMI less than 30 to decrease cardiac risk. Advised to aim for at least 150 minutes of exercise per week.     Return in 6 months (on 12/27/2024), or bp check, for 1 year physical. Patient was given opportunity to ask questions. Patient verbalized understanding of the plan and was able to repeat key elements of the plan. All questions were answered  to their satisfaction.   I, Catheryn LOISE Slocumb, MD, have reviewed all documentation for this visit. The documentation on 06/29/24 for the exam, diagnosis, procedures, and orders are all accurate and complete.

## 2024-06-29 NOTE — Assessment & Plan Note (Signed)
 Thyroid  nodule stable on recent evaluations. Annual ultrasound follow-up recommended. - Schedule follow-up thyroid  ultrasound in February 2026 - Thyroid  labs checked by [facility]

## 2024-06-29 NOTE — Assessment & Plan Note (Addendum)
 Her symptoms improved with OTC antifungal cream and Gold Bond powder.  Exam suggestive of psoriasis. I will refer her to Derm for further evaluation.

## 2024-06-30 LAB — CBC
Hematocrit: 42.2 % (ref 34.0–46.6)
Hemoglobin: 13.2 g/dL (ref 11.1–15.9)
MCH: 27.7 pg (ref 26.6–33.0)
MCHC: 31.3 g/dL — ABNORMAL LOW (ref 31.5–35.7)
MCV: 89 fL (ref 79–97)
Platelets: 240 x10E3/uL (ref 150–450)
RBC: 4.76 x10E6/uL (ref 3.77–5.28)
RDW: 13.7 % (ref 11.7–15.4)
WBC: 7 x10E3/uL (ref 3.4–10.8)

## 2024-06-30 LAB — CMP14+EGFR
ALT: 25 IU/L (ref 0–32)
AST: 19 IU/L (ref 0–40)
Albumin: 4.5 g/dL (ref 3.9–4.9)
Alkaline Phosphatase: 130 IU/L — ABNORMAL HIGH (ref 41–116)
BUN/Creatinine Ratio: 16 (ref 9–23)
BUN: 13 mg/dL (ref 6–24)
Bilirubin Total: 0.4 mg/dL (ref 0.0–1.2)
CO2: 19 mmol/L — ABNORMAL LOW (ref 20–29)
Calcium: 9.8 mg/dL (ref 8.7–10.2)
Chloride: 104 mmol/L (ref 96–106)
Creatinine, Ser: 0.8 mg/dL (ref 0.57–1.00)
Globulin, Total: 2.8 g/dL (ref 1.5–4.5)
Glucose: 95 mg/dL (ref 70–99)
Potassium: 4.9 mmol/L (ref 3.5–5.2)
Sodium: 140 mmol/L (ref 134–144)
Total Protein: 7.3 g/dL (ref 6.0–8.5)
eGFR: 90 mL/min/1.73 (ref >59)

## 2024-06-30 LAB — MICROALBUMIN / CREATININE URINE RATIO
Creatinine, Urine: 104.8 mg/dL
Microalb/Creat Ratio: <3 mg/g{creat} (ref 0–29)
Microalbumin, Urine: <3 ug/mL

## 2024-07-01 ENCOUNTER — Encounter: Payer: Self-pay | Admitting: Internal Medicine

## 2024-07-06 ENCOUNTER — Ambulatory Visit
Admission: RE | Admit: 2024-07-06 | Discharge: 2024-07-06 | Disposition: A | Discharge status: Home / Self Care | Source: Ambulatory Visit | Attending: Internal Medicine | Admitting: Internal Medicine

## 2024-07-06 DIAGNOSIS — R3129 Other microscopic hematuria: Secondary | ICD-10-CM | POA: Diagnosis present

## 2024-07-11 ENCOUNTER — Ambulatory Visit: Payer: Self-pay | Admitting: Internal Medicine

## 2024-07-16 ENCOUNTER — Ambulatory Visit
Admission: EM | Admit: 2024-07-16 | Discharge: 2024-07-16 | Disposition: A | Discharge status: Home / Self Care | Attending: Internal Medicine | Admitting: Internal Medicine

## 2024-07-16 ENCOUNTER — Encounter: Payer: Self-pay | Admitting: Emergency Medicine

## 2024-07-16 DIAGNOSIS — J069 Acute upper respiratory infection, unspecified: Secondary | ICD-10-CM

## 2024-07-16 MED ORDER — PROMETHAZINE-DM 6.25-15 MG/5ML PO SYRP: 5.0000 mL | ORAL_SOLUTION | Freq: Every evening | ORAL | 0 refills | Status: AC | PRN

## 2024-07-16 MED ORDER — GUAIFENESIN ER 1200 MG PO TB12: 1200.0000 mg | ORAL_TABLET | Freq: Two times a day (BID) | ORAL | 0 refills | Status: AC

## 2024-07-16 NOTE — ED Triage Notes (Signed)
 Patient reports productive cough with yellow and greenish mucus, runny nose and sore throat x 4 days. Patient states she has tried several OTC cough syrups with no improvement. Patient states sore throat has resolved. Denies pan at this time.

## 2024-07-16 NOTE — ED Provider Notes (Signed)
 Kristen Wise    CSN: [id number] Arrival date & time: 07/16/24  0801      History   Chief Complaint Chief Complaint  Patient presents with   Cough   Nasal Congestion    HPI Kristen Wise is a 48 y.o. female.   Kristen Wise is a 48 y.o. female presenting for chief complaint of cough, nasal congestion, sore throat, and generalized fatigue that started 4 days ago on Sunday July 12, 2024. Today is day 5 of symptoms. Cough is productive with yellow/green phlegm. She has not had a fever. Denies recent sick contacts with similar symptoms. No history of asthma/copd. Never smoker. Taking OTC medications with some relief.    Cough   Past Medical History:  Diagnosis Date   Diabetes mellitus without complication (HCC)    Fibroid 08/01/2011   utrerine   H/O psoriasis    History of chicken pox    History of irregular menstrual bleeding    Hypertension    Multiple thyroid  nodules 10/2023    Patient Active Problem List   Diagnosis Date Noted   Scaly patch rash 06/29/2024   Symptomatic menopausal or female climacteric states 03/10/2024   Right thyroid  nodule 11/10/2023   Pulsatile tinnitus of right ear 11/07/2023   Annual physical exam 06/27/2023   Dyslipidemia due to type 2 diabetes mellitus (HCC) 06/27/2023   Arthralgia 06/27/2023   Vitamin D  deficiency 06/27/2023   Class 2 severe obesity due to excess calories with serious comorbidity and body mass index (BMI) of 38.0 to 38.9 in adult 06/27/2023   Pure hypercholesterolemia 09/02/2021   Chronic constipation 09/02/2021   Tinnitus of both ears 01/03/2021   Posterior tibial tendinitis, unspecified leg 05/14/2018   Dysuria 04/07/2018   Essential hypertension, benign 04/07/2018   Keloid 04/07/2018   Other long term (current) drug therapy 04/07/2018   Shortness of breath 04/07/2018   Type 2 diabetes mellitus with hyperglycemia (HCC) 04/07/2018   GERD with apnea 06/05/2017   Snoring  06/05/2017   Sleep deprivation 06/05/2017   Hypersomnia with sleep apnea 06/05/2017   Body mass index (BMI) of 38.0 to 38.9 in adult 05/01/2017   Symptoms involving urinary system 02/06/2017   Sore throat 02/06/2017   Pain 11/21/2016   Seborrheic dermatitis 12/24/2012   Fibroids 02/19/2012   Other psoriasis 11/21/2011   Superficial injury of skin 11/21/2011    Past Surgical History:  Procedure Laterality Date   ABDOMINAL HYSTERECTOMY  10/31/2017   BREAST BIOPSY Right    BREAST BIOPSY Right 01/16/2024   MM RT BREAST BX W LOC DEV 1ST LESION IMAGE BX SPEC STEREO GUIDE 01/16/2024 [facility]   BREAST BIOPSY Right 01/16/2024   MM RT BREAST BX W LOC DEV EA AD LESION IMG BX SPEC STEREO GUIDE 01/16/2024 [facility]   DILATION AND CURETTAGE OF UTERUS      OB History     Gravida  1   Para      Term      Preterm      AB  1   Living         SAB      IAB  1   Ectopic      Multiple      Live Births               Home Medications    Prior to Admission medications   Medication Sig Start Date End Date Taking? Authorizing Provider  Guaifenesin 1200 MG TB12 Take  1 tablet (1,200 mg total) by mouth in the morning and at bedtime. 07/16/24  Yes Enedelia Dorna HERO, FNP  promethazine-dextromethorphan (PROMETHAZINE-DM) 6.25-15 MG/5ML syrup Take 5 mLs by mouth at bedtime as needed for cough. 07/16/24  Yes Enedelia Dorna HERO, FNP  acetaminophen (TYLENOL) 325 MG tablet Take by mouth. 10/31/17   [provider]  amLODipine  (NORVASC ) 10 MG tablet TAKE 1 TABLET BY MOUTH DAILY 08/13/23   Jarold Medici, MD  atorvastatin  (LIPITOR) 20 MG tablet Take 1 tablet (20 mg total) by mouth daily. 06/29/24   Jarold Medici, MD  cetirizine  (ZYRTEC  ALLERGY) 10 MG tablet Take 1 tablet (10 mg total) by mouth daily. 12/15/20 06/29/24  Moore, Janece, FNP  clobetasol ointment (TEMOVATE) 0.05 % clobetasol 0.05 % topical ointment  APPLY TO SCALP 3 TIMES PER WEEK AS NEEDED. NOT TO FACE.  04/04/16   [provider]  Continuous Glucose Sensor (DEXCOM G7 SENSOR) MISC Use as directed to monitor blood sugars 02/25/23   Jarold Medici, MD  estradiol  (VIVELLE -DOT) 0.05 MG/24HR patch Place 1 patch (0.05 mg total) onto the skin 2 (two) times a week. 04/30/24   Hines, Vera GAILS, MD  hydrocortisone 2.5 % cream Apply to affected areas on the nose twice daily for one week, then daily as needed. 09/16/19   [provider]  lidocaine-prilocaine (EMLA) cream Apply topically. 05/01/21   [provider]  meloxicam  (MOBIC ) 15 MG tablet Take 15 mg by mouth daily. 01/12/24   [provider]  metFORMIN  (GLUCOPHAGE ) 500 MG tablet TAKE 1 TABLET BY MOUTH TWICE  DAILY WITH A MEAL 01/07/24   Jarold Medici, MD  olmesartan  (BENICAR ) 40 MG tablet Take 1 tablet (40 mg total) by mouth daily. 06/29/24   Jarold Medici, MD  omeprazole  [facility]) 40 MG capsule One capsule po qd prn 08/18/18   Jarold Medici, MD  OZEMPIC , 0.25 OR 0.5 MG/DOSE, 2 MG/3ML SOPN INJECT 0.5 MG UNDER THE SKIN ONCE A WEEK 04/27/24   Jarold Medici, MD  polyethylene glycol (MIRALAX / GLYCOLAX) 17 g packet Take by mouth. 10/31/17   [provider]  spironolactone  (ALDACTONE ) 50 MG tablet Take 1 tablet (50 mg total) by mouth daily. 06/29/24   Jarold Medici, MD  triamcinolone acetonide [facility]) 40 MG/ML injection by Intra-Lesional route. 11/13/23   [provider]  triamcinolone ointment (KENALOG) 0.1 % Apply to affected areas on legs twice daily for 2 wks, then daily for 2 wks, then every other day for 2 wks. Not to face. 01/20/20   [provider]  Vitamin D , Ergocalciferol , (DRISDOL ) 1.25 MG (50000 UNIT) CAPS capsule  07/13/20   [provider]    Family History Family History  Problem Relation Age of Onset   Diabetes Maternal Grandmother    Breast cancer Maternal Aunt    Breast cancer Cousin 45   Hypertension Mother    Diabetes Father    Hyperlipidemia Father    Seizures  Neg Hx     Social History Social History   Tobacco Use   Smoking status: Never   Smokeless tobacco: Never  Vaping Use   Vaping status: Never Used  Substance Use Topics   Alcohol use: No   Drug use: No     Allergies   Lisinopril   Review of Systems Review of Systems  Respiratory:  Positive for cough.   Per HPI   Physical Exam Triage Vital Signs ED Triage Vitals  Encounter Vitals Group     BP 07/16/24 0824 100/64  Girls Systolic BP Percentile --      Girls Diastolic BP Percentile --      Boys Systolic BP Percentile --      Boys Diastolic BP Percentile --      Pulse Rate 07/16/24 0824 75     Resp 07/16/24 0824 18     Temp 07/16/24 0824 98.3 F (36.8 C)     Temp src --      SpO2 07/16/24 0824 99 %     Weight --      Height --      Head Circumference --      Peak Flow --      Pain Score 07/16/24 0822 0     Pain Loc --      Pain Education --      Exclude from Growth Chart --    No data found.  Updated Vital Signs BP 100/64 (BP Location: Left Arm)   Pulse 75   Temp 98.3 F (36.8 C)   Resp 18   LMP 07/24/2018   SpO2 99%   Visual Acuity Right Eye Distance:   Left Eye Distance:   Bilateral Distance:    Right Eye Near:   Left Eye Near:    Bilateral Near:     Physical Exam Vitals and nursing note reviewed.  Constitutional:      Appearance: She is not ill-appearing or toxic-appearing.  HENT:     Head: Normocephalic and atraumatic.     Right Ear: Hearing, tympanic membrane, ear canal and external ear normal.     Left Ear: Hearing, tympanic membrane, ear canal and external ear normal.     Nose: Congestion present.     Mouth/Throat:     Lips: Pink.     Mouth: Mucous membranes are moist. No injury or oral lesions.     Dentition: Normal dentition.     Tongue: No lesions.     Pharynx: Oropharynx is clear. Uvula midline. No pharyngeal swelling, oropharyngeal exudate, posterior oropharyngeal erythema, uvula swelling or postnasal drip.     Tonsils:  No tonsillar exudate.  Eyes:     General: Lids are normal. Vision grossly intact. Gaze aligned appropriately.     Extraocular Movements: Extraocular movements intact.     Conjunctiva/sclera: Conjunctivae normal.  Neck:     Trachea: Trachea and phonation normal.  Cardiovascular:     Rate and Rhythm: Normal rate and regular rhythm.     Heart sounds: Normal heart sounds, S1 normal and S2 normal.  Pulmonary:     Effort: Pulmonary effort is normal. No respiratory distress.     Breath sounds: Normal breath sounds and air entry.  Musculoskeletal:     Cervical back: Neck supple.  Lymphadenopathy:     Cervical: No cervical adenopathy.  Skin:    General: Skin is warm and dry.     Capillary Refill: Capillary refill takes less than 2 seconds.     Findings: No rash.  Neurological:     General: No focal deficit present.     Mental Status: She is alert and oriented to person, place, and time. Mental status is at baseline.     Cranial Nerves: No dysarthria or facial asymmetry.  Psychiatric:        Mood and Affect: Mood normal.        Speech: Speech normal.        Behavior: Behavior normal.        Thought Content: Thought content normal.  Judgment: Judgment normal.      UC Treatments / Results  Labs (all labs ordered are listed, but only abnormal results are displayed) Labs Reviewed - No data to display  EKG   Radiology No results found.  Procedures Procedures (including critical care time)  Medications Ordered in UC Medications - No data to display  Initial Impression / Assessment and Plan / UC Course  I have reviewed the triage vital signs and the nursing notes.  Pertinent labs & imaging results that were available during my care of the patient were reviewed by me and considered in my medical decision making (see chart for details).   1. Viral URI with cough Suspect viral URI, viral syndrome.  Strep/viral testing: POC COVID-19 testing is negative.   Physical exam  findings reassuring, vital signs hemodynamically stable, and lungs clear, therefore deferred imaging of the chest.  Advised supportive care/prescriptions for symptomatic relief as outlined in AVS.   Counseled patient on potential for adverse effects with medications prescribed/recommended today, strict ER and return-to-clinic precautions discussed, patient verbalized understanding.    Final Clinical Impressions(s) / UC Diagnoses   Final diagnoses:  Viral URI with cough     Discharge Instructions      You have a viral illness which will improve on its own with rest, fluids, and medications to help with your symptoms.  COVID-19 testing is negative.   Tylenol, guaifenesin (plain mucinex), and saline nasal sprays may help relieve symptoms.   Two teaspoons of honey in 1 cup of warm water every 4-6 hours may help with throat pains.  Humidifier in room at nighttime may help soothe cough (clean well daily).   Take Promethazine DM cough medication to help with your cough at nighttime so that you are able to sleep. Do not drive, drink alcohol, or go to work while taking this medication since it can make you sleepy. Only take this at nighttime.   For chest pain, shortness of breath, inability to keep food or fluids down without vomiting, fever that does not respond to tylenol or motrin, or any other severe symptoms, please go to the ER for further evaluation. Return to urgent care as needed, otherwise follow-up with PCP.       ED Prescriptions     Medication Sig Dispense Auth. Provider   promethazine-dextromethorphan (PROMETHAZINE-DM) 6.25-15 MG/5ML syrup Take 5 mLs by mouth at bedtime as needed for cough. 118 mL Enedelia Going M, FNP   Guaifenesin 1200 MG TB12 Take 1 tablet (1,200 mg total) by mouth in the morning and at bedtime. 14 tablet Enedelia Going HERO, FNP      PDMP not reviewed this encounter.   Enedelia Going [place], [place] 07/16/24 [phone]

## 2024-07-16 NOTE — Discharge Instructions (Addendum)
 You have a viral illness which will improve on its own with rest, fluids, and medications to help with your symptoms.  COVID-19 testing is negative.   Tylenol, guaifenesin (plain mucinex), and saline nasal sprays may help relieve symptoms.   Two teaspoons of honey in 1 cup of warm water every 4-6 hours may help with throat pains.  Humidifier in room at nighttime may help soothe cough (clean well daily).   Take Promethazine DM cough medication to help with your cough at nighttime so that you are able to sleep. Do not drive, drink alcohol, or go to work while taking this medication since it can make you sleepy. Only take this at nighttime.   For chest pain, shortness of breath, inability to keep food or fluids down without vomiting, fever that does not respond to tylenol or motrin, or any other severe symptoms, please go to the ER for further evaluation. Return to urgent care as needed, otherwise follow-up with PCP.

## 2024-07-29 ENCOUNTER — Ambulatory Visit: Admitting: Obstetrics and Gynecology

## 2024-08-07 ENCOUNTER — Other Ambulatory Visit: Payer: Self-pay | Admitting: Internal Medicine

## 2024-08-07 DIAGNOSIS — E785 Hyperlipidemia, unspecified: Secondary | ICD-10-CM

## 2024-08-18 ENCOUNTER — Encounter: Payer: Self-pay | Admitting: Internal Medicine

## 2024-08-19 LAB — OPHTHALMOLOGY REPORT-SCANNED

## 2024-08-24 ENCOUNTER — Ambulatory Visit: Admitting: Obstetrics and Gynecology

## 2024-09-07 ENCOUNTER — Ambulatory Visit: Admitting: Obstetrics and Gynecology

## 2024-09-07 ENCOUNTER — Encounter: Payer: Self-pay | Admitting: Obstetrics and Gynecology

## 2024-09-07 VITALS — BP 114/60 | HR 77 | Temp 97.8°F | Wt 240.0 lb

## 2024-09-07 DIAGNOSIS — N951 Menopausal and female climacteric states: Secondary | ICD-10-CM | POA: Diagnosis not present

## 2024-09-07 NOTE — Progress Notes (Signed)
 48 y.o. G12P0010 female s/p RATLH, BS ([facility] 2019, fibroids, endometriosis) here for referral for med f/u. Married.  Patient's last menstrual period was 07/24/2018.   She was started on an estradiol  patch for menopause symptoms, however she stopped after 3 months because she was worried about breast cancer risk with HRT. Her symptoms did resolve with her patch, however she is not interested in HRT at this time due to prior breast recall in April 2025.  GYN HISTORY: No sig hx  OB History  Gravida Para Term Preterm AB Living  1    1   SAB IAB Ectopic Multiple Live Births   1       # Outcome Date GA Lbr Len/2nd Weight Sex Type Anes PTL Lv  1 IAB            Past Medical History:  Diagnosis Date   Diabetes mellitus without complication (HCC)    Fibroid 08/01/2011   utrerine   H/O psoriasis    History of chicken pox    History of irregular menstrual bleeding    Hypertension    Multiple thyroid  nodules 10/2023   Past Surgical History:  Procedure Laterality Date   ABDOMINAL HYSTERECTOMY  10/31/2017   BREAST BIOPSY Right    BREAST BIOPSY Right 01/16/2024   MM RT BREAST BX W LOC DEV 1ST LESION IMAGE BX SPEC STEREO GUIDE 01/16/2024 [facility]   BREAST BIOPSY Right 01/16/2024   MM RT BREAST BX W LOC DEV EA AD LESION IMG BX SPEC STEREO GUIDE 01/16/2024 [facility]   DILATION AND CURETTAGE OF UTERUS     Current Outpatient Medications on File Prior to Visit  Medication Sig Dispense Refill   acetaminophen (TYLENOL) 325 MG tablet Take by mouth.     amLODipine  (NORVASC ) 10 MG tablet TAKE 1 TABLET BY MOUTH DAILY 90 tablet 3   atorvastatin  (LIPITOR) 20 MG tablet Take 1 tablet (20 mg total) by mouth daily. 90 tablet 3   cetirizine  (ZYRTEC  ALLERGY) 10 MG tablet Take 1 tablet (10 mg total) by mouth daily. 30 tablet 2   clobetasol ointment (TEMOVATE) 0.05 % clobetasol 0.05 % topical ointment  APPLY TO SCALP 3 TIMES PER WEEK AS NEEDED. NOT TO FACE.     Continuous Glucose Sensor (DEXCOM  G7 SENSOR) MISC Use as directed to monitor blood sugars 2 each 3   Guaifenesin  1200 MG TB12 Take 1 tablet (1,200 mg total) by mouth in the morning and at bedtime. 14 tablet 0   hydrocortisone 2.5 % cream Apply to affected areas on the nose twice daily for one week, then daily as needed.     lidocaine-prilocaine (EMLA) cream Apply topically.     meloxicam  (MOBIC ) 15 MG tablet Take 15 mg by mouth daily.     metFORMIN  (GLUCOPHAGE ) 500 MG tablet TAKE 1 TABLET BY MOUTH TWICE  DAILY WITH A MEAL 180 tablet 3   olmesartan  (BENICAR ) 40 MG tablet Take 1 tablet (40 mg total) by mouth daily. 90 tablet 3   omeprazole  (PRILOSEC) 40 MG capsule One capsule po qd prn 90 capsule 1   OZEMPIC , 0.25 OR 0.5 MG/DOSE, 2 MG/3ML SOPN INJECT 0.5 MG UNDER THE SKIN ONCE A WEEK 3 mL 1   polyethylene glycol (MIRALAX / GLYCOLAX) 17 g packet Take by mouth.     promethazine -dextromethorphan (PROMETHAZINE -DM) 6.25-15 MG/5ML syrup Take 5 mLs by mouth at bedtime as needed for cough. 118 mL 0   spironolactone  (ALDACTONE ) 50 MG tablet Take 1 tablet (50  mg total) by mouth daily. 90 tablet 3   triamcinolone acetonide (KENALOG-40) 40 MG/ML injection by Intra-Lesional route.     triamcinolone ointment (KENALOG) 0.1 % Apply to affected areas on legs twice daily for 2 wks, then daily for 2 wks, then every other day for 2 wks. Not to face.     Vitamin D , Ergocalciferol , (DRISDOL ) 1.25 MG (50000 UNIT) CAPS capsule      estradiol  (VIVELLE -DOT) 0.05 MG/24HR patch Place 1 patch (0.05 mg total) onto the skin 2 (two) times a week. (Patient not taking: Reported on 09/07/2024) 24 patch 3   No current facility-administered medications on file prior to visit.   Allergies  Allergen Reactions   Lisinopril Other (See Comments)      PE Today's Vitals   09/07/24 1646  BP: 114/60  Pulse: 77  Temp: 97.8 F (36.6 C)  TempSrc: Oral  SpO2: 98%  Weight: 240 lb (108.9 kg)    Body mass index is 37.59 kg/m.  Physical Exam Vitals reviewed.   Constitutional:      General: She is not in acute distress.    Appearance: Normal appearance.  HENT:     Head: Normocephalic and atraumatic.     Nose: Nose normal.  Eyes:     Extraocular Movements: Extraocular movements intact.     Conjunctiva/sclera: Conjunctivae normal.  Pulmonary:     Effort: Pulmonary effort is normal.  Musculoskeletal:        General: Normal range of motion.     Cervical back: Normal range of motion.  Neurological:     General: No focal deficit present.     Mental Status: She is alert.  Psychiatric:        Mood and Affect: Mood normal.        Behavior: Behavior normal.       Assessment and Plan:        Perimenopausal symptoms  Discussed nonhormonal options, lifestyle modifications and natural supplements Patient will consider this for now RTO for annual next year  18 min  total time was spent for this patient encounter, including preparation, face-to-face counseling with the patient, coordination of care, and documentation of the encounter.    Vera LULLA Pa, MD

## 2024-09-11 ENCOUNTER — Other Ambulatory Visit: Payer: Self-pay | Admitting: Internal Medicine

## 2024-09-11 DIAGNOSIS — I10 Essential (primary) hypertension: Secondary | ICD-10-CM

## 2024-11-16 ENCOUNTER — Other Ambulatory Visit

## 2024-11-19 ENCOUNTER — Other Ambulatory Visit

## 2024-11-24 ENCOUNTER — Other Ambulatory Visit

## 2024-12-29 ENCOUNTER — Ambulatory Visit: Payer: Self-pay | Admitting: Internal Medicine

## 2025-02-08 ENCOUNTER — Ambulatory Visit: Admitting: Dermatology

## 2025-06-30 ENCOUNTER — Encounter: Payer: Self-pay | Admitting: Internal Medicine
